# Patient Record
Sex: Female | Born: 1985 | Race: Black or African American | Hispanic: No | Marital: Single | Smoking: Never smoker
Health system: Southern US, Community
[De-identification: ages and names within clinical notes are randomized; demographics above are authoritative.]

## PROBLEM LIST (undated history)

## (undated) DIAGNOSIS — I10 Essential (primary) hypertension: Secondary | ICD-10-CM

## (undated) HISTORY — DX: Essential (primary) hypertension: I10

## (undated) HISTORY — PX: OTHER SURGICAL HISTORY: SHX169

---

## 2003-05-13 ENCOUNTER — Encounter: Admission: RE | Admit: 2003-05-13 | Discharge: 2003-05-13 | Payer: Self-pay | Admitting: Internal Medicine

## 2004-05-14 ENCOUNTER — Emergency Department (HOSPITAL_COMMUNITY): Admission: EM | Admit: 2004-05-14 | Discharge: 2004-05-15 | Payer: Self-pay | Admitting: Emergency Medicine

## 2009-07-21 ENCOUNTER — Emergency Department: Payer: Self-pay | Admitting: Emergency Medicine

## 2011-10-19 ENCOUNTER — Emergency Department: Payer: Self-pay | Admitting: Emergency Medicine

## 2012-06-02 IMAGING — CT CT ABD-PELV W/O CM
1 of 2 series · 16 of 32 positions shown, 20 images · non-contrast
Comparison: none

REASON FOR EXAM: (1) L flank and LLQ pain; (2) L flank and LLQ pain,
stone protocol
COMMENTS:   May transport without cardiac monitor

PROCEDURE:     CT  - CT ABDOMEN AND PELVIS W[DATE]  [DATE]
RESULT:
HISTORY: Left flank pain.

[Series 2: stone · axial · 0.89mm/px · z∈[-526,-124]mm · 16 of 146 slices shown, 20 images]
[im 6/146  soft-tissue]
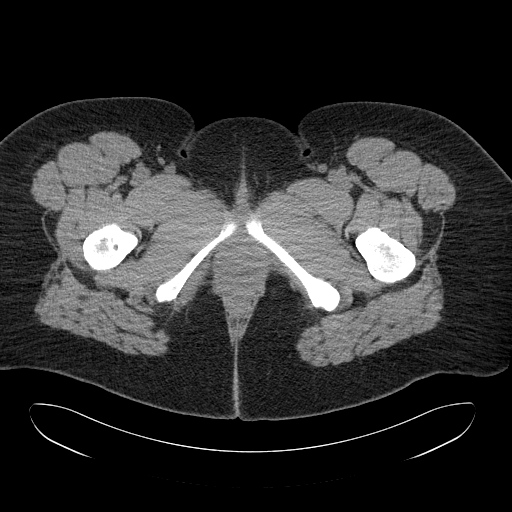
[im 6/146  bone]
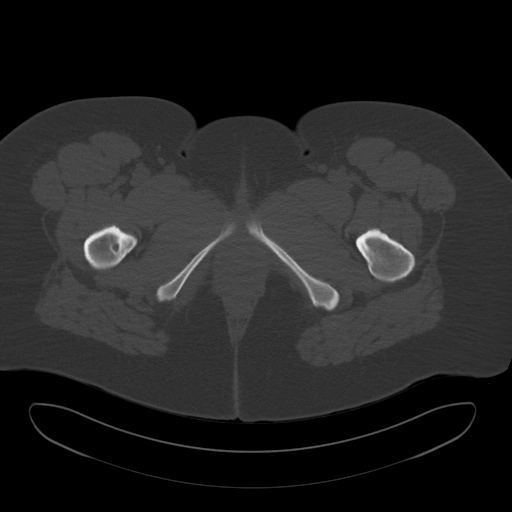
[im 18/146  soft-tissue]
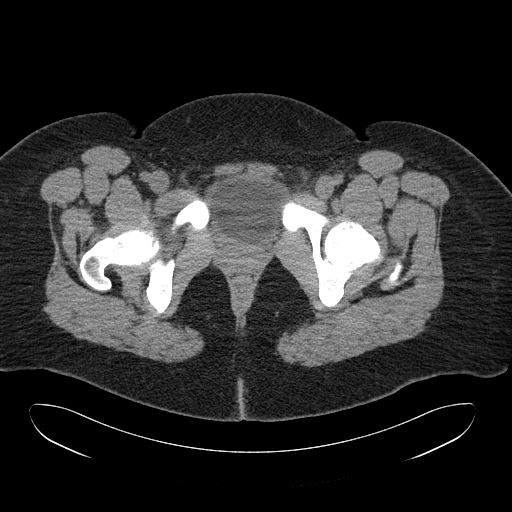
[im 30/146  soft-tissue]
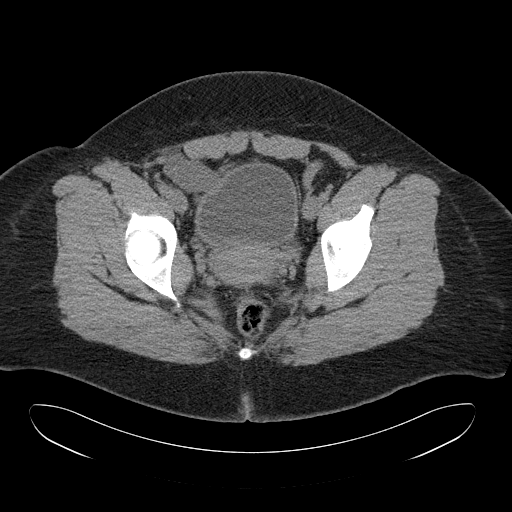
[im 41/146  soft-tissue]
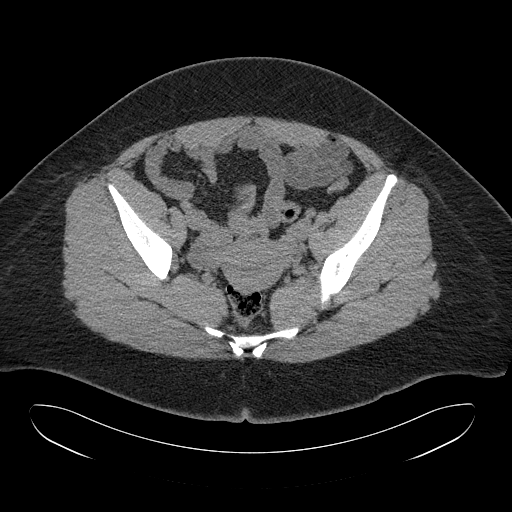
[im 47/146  soft-tissue]
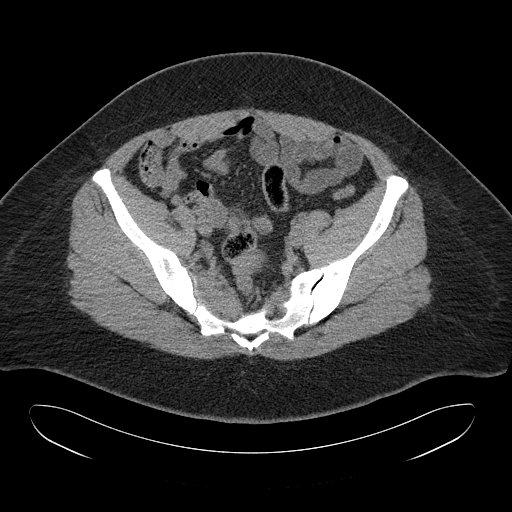
[im 59/146  soft-tissue]
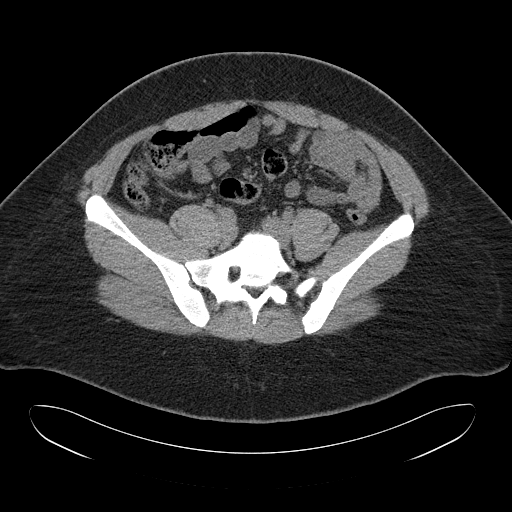
[im 70/146  soft-tissue]
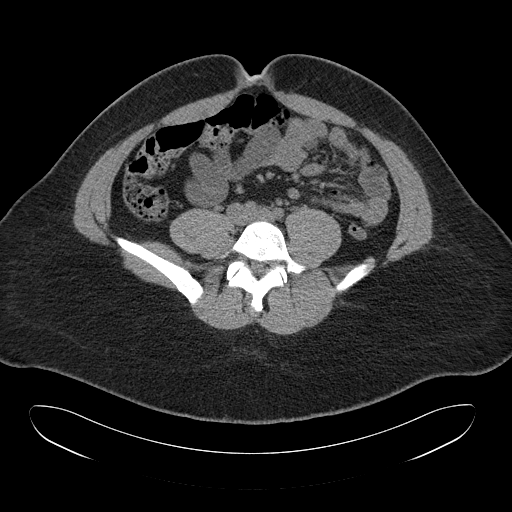
[im 76/146  soft-tissue]
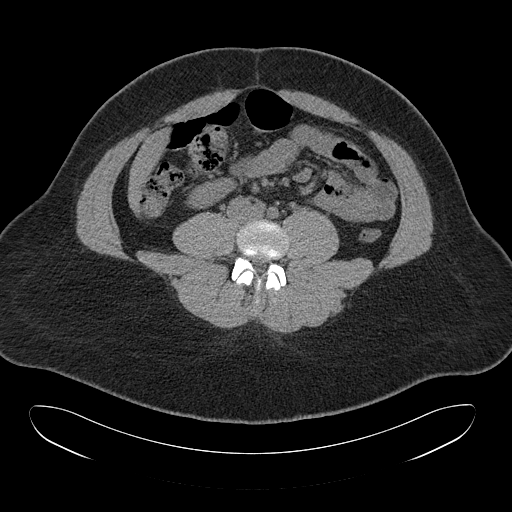
[im 88/146  soft-tissue]
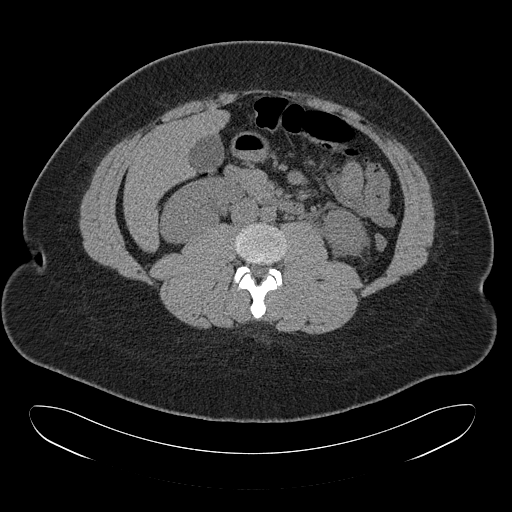
[im 88/146  bone]
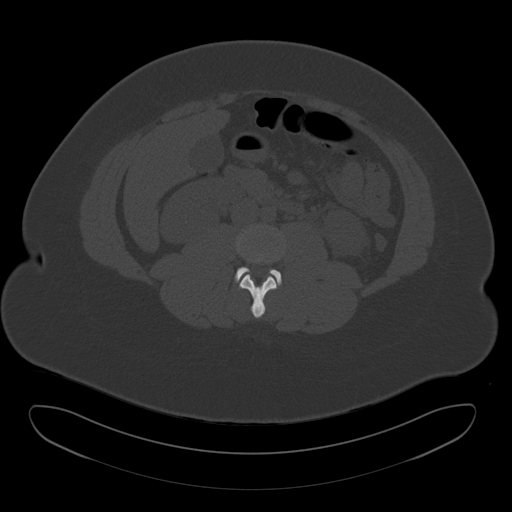
[im 99/146  soft-tissue]
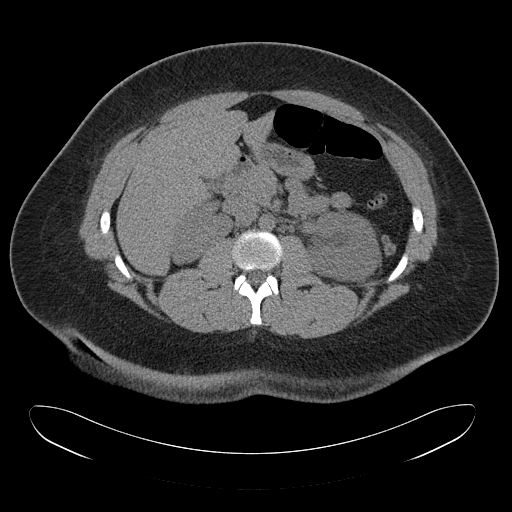
[im 111/146  soft-tissue]
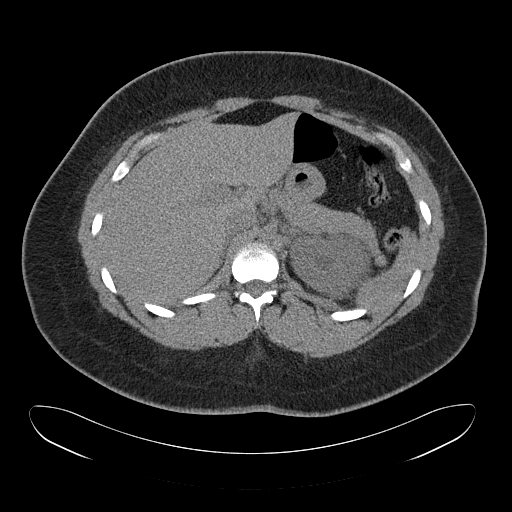
[im 117/146  soft-tissue]
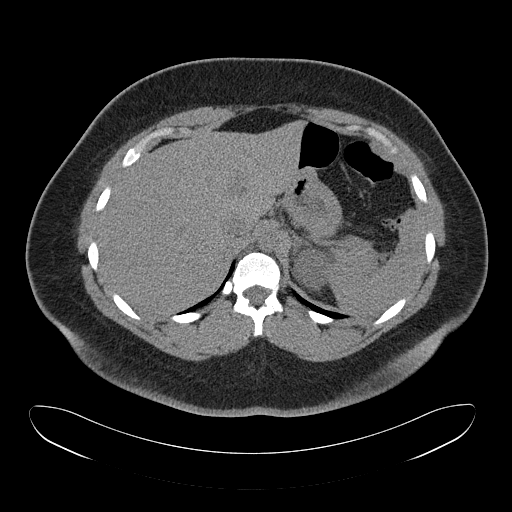
[im 122/146  lung]
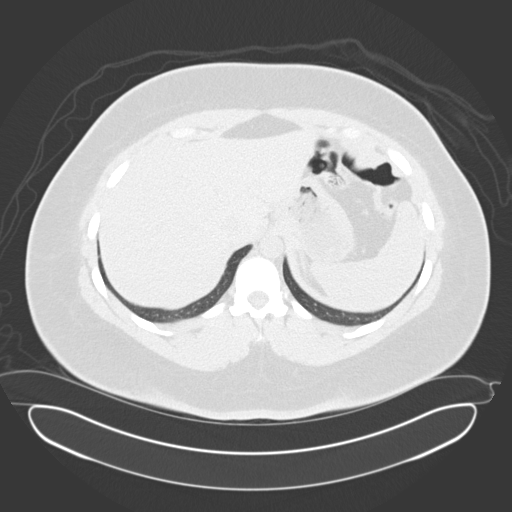
[im 128/146  soft-tissue]
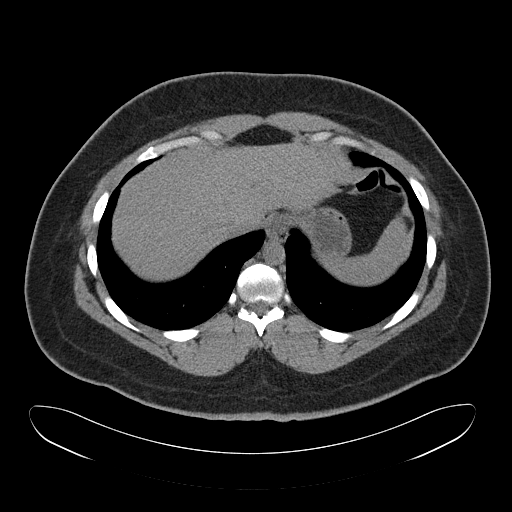
[im 128/146  lung]
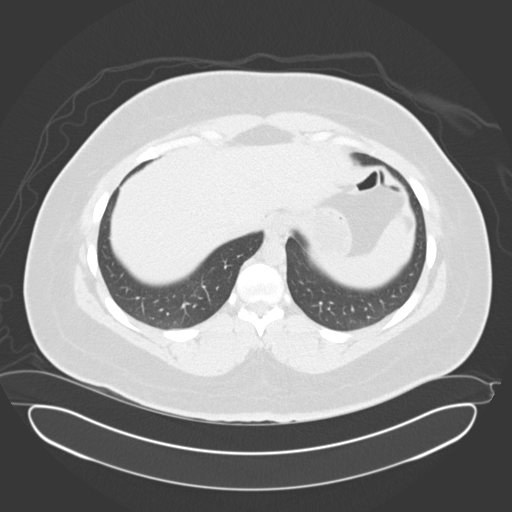
[im 134/146  lung]
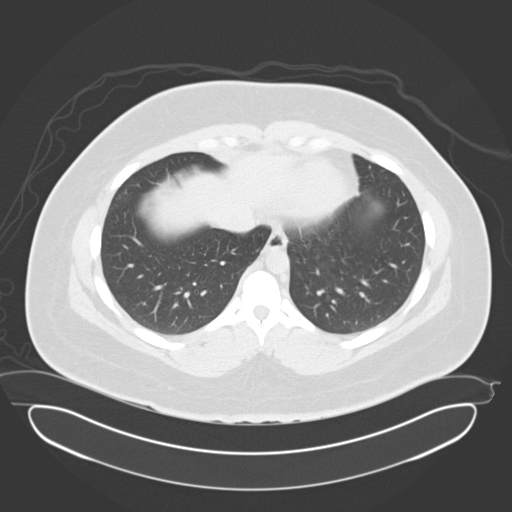
[im 140/146  soft-tissue]
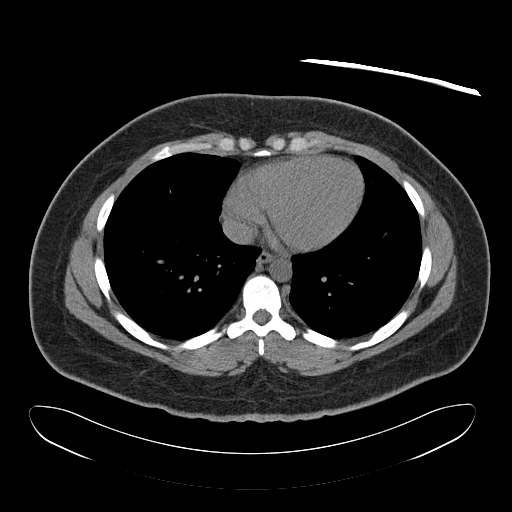
[im 140/146  lung]
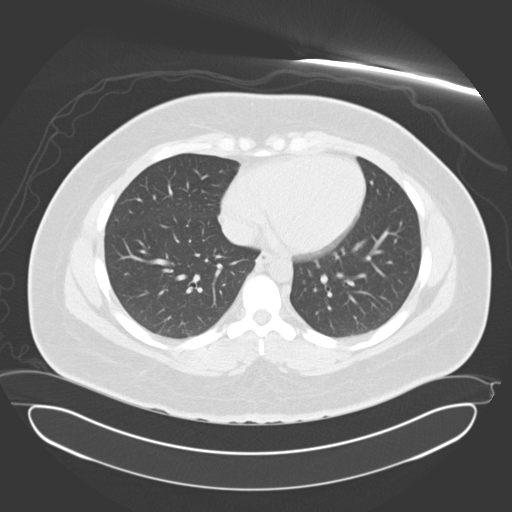

[16 of 32 positions shown; findings below may reference images not displayed]

FINDINGS: Standard nonenhanced head CT was obtained. The liver is normal.
Spleen is normal. Pancreas is normal. Adrenals are normal. Right kidney is
unremarkable. Left hydronephrosis and hydroureter is noted. There is an
approximately 6 mm stone in the distal left ureter. There is there is no
bowel distention. The bladder is unremarkable. Lung bases are clear.
IMPRESSION: 1.     6 mm calculus in the distal left ureter.
2.     There is mild hydronephrosis and hydroureter.

## 2014-12-07 LAB — HM PAP SMEAR: HM Pap smear: NEGATIVE

## 2017-12-22 DIAGNOSIS — E282 Polycystic ovarian syndrome: Secondary | ICD-10-CM | POA: Insufficient documentation

## 2017-12-22 DIAGNOSIS — I10 Essential (primary) hypertension: Secondary | ICD-10-CM | POA: Insufficient documentation

## 2017-12-22 LAB — HM HIV SCREENING LAB: HM HIV Screening: NEGATIVE

## 2018-12-24 DIAGNOSIS — E282 Polycystic ovarian syndrome: Secondary | ICD-10-CM

## 2018-12-24 DIAGNOSIS — I10 Essential (primary) hypertension: Secondary | ICD-10-CM

## 2018-12-25 ENCOUNTER — Other Ambulatory Visit: Payer: Self-pay

## 2018-12-25 DIAGNOSIS — Z20822 Contact with and (suspected) exposure to covid-19: Secondary | ICD-10-CM

## 2018-12-26 LAB — NOVEL CORONAVIRUS, NAA: SARS-CoV-2, NAA: NOT DETECTED

## 2019-01-05 ENCOUNTER — Ambulatory Visit (LOCAL_COMMUNITY_HEALTH_CENTER): Payer: Self-pay | Admitting: Family Medicine

## 2019-01-05 ENCOUNTER — Other Ambulatory Visit: Payer: Self-pay

## 2019-01-05 VITALS — BP 141/89 | Ht 68.0 in | Wt 290.0 lb

## 2019-01-05 DIAGNOSIS — Z30011 Encounter for initial prescription of contraceptive pills: Secondary | ICD-10-CM

## 2019-01-05 DIAGNOSIS — Z113 Encounter for screening for infections with a predominantly sexual mode of transmission: Secondary | ICD-10-CM

## 2019-01-05 DIAGNOSIS — Z3009 Encounter for other general counseling and advice on contraception: Secondary | ICD-10-CM

## 2019-01-05 MED ORDER — NORETHINDRONE 0.35 MG PO TABS: 1.0000 | ORAL_TABLET | Freq: Every day | ORAL | 0 refills | Status: DC

## 2019-01-05 MED ORDER — NORETHINDRONE 0.35 MG PO TABS: 1.0000 | ORAL_TABLET | Freq: Every day | ORAL | 11 refills | Status: DC

## 2019-01-05 NOTE — Progress Notes (Signed)
Last physical at [facility] 12/22/2017, #13 packs Micronor dispensed. Per [facility] records: 12/07/14 PAP was NIL & HPV negative (Cotest PAP due 2021, CBE due 2022). Desire STD checks, OCP supply. C/O tenderness in right breast. Pt states she has taken her BP pill this morning.

## 2019-01-05 NOTE — Progress Notes (Signed)
   [place] problem visit  [place] Department  Subjective:  Tammy Daugherty is a 33 y.o. being seen today for   Chief Complaint  Patient presents with  . Contraception    OCP supply  . SEXUALLY TRANSMITTED DISEASE    STD screening including bloodwork  . Breast Problem    Tenderness in right breast    HPI  Client is here for refill of her Micronor and STD screening.  Client last exam was 12/2017.  She denies STD symptoms.  Does the patient have a current or past history of drug use? No   No components found for: HCV]   Health Maintenance Due  Topic Date Due  . TETANUS/TDAP  07/08/2002  . INFLUENZA VACCINE  08/22/2018    ROS  The following portions of the patient's history were reviewed and updated as appropriate: allergies, current medications, past family history, past medical history, past social history, past surgical history and problem list. Problem list updated.   See flowsheet for other program required questions.  Objective:   Vitals:   01/05/19 0820  BP: (!) 141/89  Weight: 290 lb (131.5 kg)  Height: 5\' 8"  (1.727 m)    Physical Exam Constitutional:      Appearance: She is normal weight.  HENT:     Mouth/Throat:     Mouth: Mucous membranes are moist.     Pharynx: Oropharynx is clear. No oropharyngeal exudate or posterior oropharyngeal erythema.  Chest:     Breasts: Breasts are symmetrical.        Right: Normal.        Left: Normal.  Abdominal:     Palpations: Abdomen is soft. There is no mass.     Tenderness: There is no abdominal tenderness.     Hernia: There is no hernia in the left inguinal area or right inguinal area.  Genitourinary:    Pubic Area: No rash or pubic lice.      Labia:        Right: No rash, tenderness or lesion.        Left: No rash, tenderness, lesion or injury.      Cervix: Normal.     Comments: Bimanual declined Musculoskeletal:     Cervical back: Neck supple. No tenderness.   Lymphadenopathy:     Cervical: No cervical adenopathy.     Upper Body:     Right upper body: No supraclavicular, axillary or pectoral adenopathy.     Left upper body: No supraclavicular, axillary or pectoral adenopathy.     Lower Body: No right inguinal adenopathy. No left inguinal adenopathy.  Skin:    General: Skin is warm and dry.     Findings: No lesion or rash.  Neurological:     Mental Status: She is alert.  Psychiatric:        Mood and Affect: Mood normal.    Assessment and Plan:  Tammy Daugherty is a 33 y.o. female presenting to the [facility] Department for a Women's Health problem visit  1. General counseling and advice on contraceptive management   2. OCP (oral contraceptive pills) initiation - norethindrone (MICRONOR) 0.35 MG tablet; Take 1 tablet (0.35 mg total) by mouth daily.  Dispense: 1 Package; Refill: 11  3. Screening examination for venereal disease  - Chlamydia/Gonorrhea [place] [facility] - [facility] [place] [facility] - Syphilis Serology, [place]  No follow-ups on file.  No future appointments.  Hassell Done, FNP

## 2019-01-05 NOTE — Progress Notes (Signed)
Per verbal order by Hassell Done, FNP, dispensed #3 packs of Micronor (Norethindrone) to pt; pt to RTC before running out of OCP for physical. Pt counseled to used back up method like condoms with OCP restart and any time she misses or takes OCP late. Provider orders completed.

## 2019-01-23 ENCOUNTER — Other Ambulatory Visit: Payer: Self-pay

## 2019-01-23 ENCOUNTER — Encounter: Payer: Self-pay | Admitting: Emergency Medicine

## 2019-01-23 ENCOUNTER — Emergency Department: Payer: Self-pay

## 2019-01-23 DIAGNOSIS — E876 Hypokalemia: Secondary | ICD-10-CM | POA: Insufficient documentation

## 2019-01-23 DIAGNOSIS — R0789 Other chest pain: Secondary | ICD-10-CM | POA: Insufficient documentation

## 2019-01-23 DIAGNOSIS — Z79899 Other long term (current) drug therapy: Secondary | ICD-10-CM | POA: Insufficient documentation

## 2019-01-23 DIAGNOSIS — I10 Essential (primary) hypertension: Secondary | ICD-10-CM | POA: Insufficient documentation

## 2019-01-23 LAB — BASIC METABOLIC PANEL
Anion gap: 15 (ref 5–15)
BUN: 7 mg/dL (ref 6–20)
CO2: 26 mmol/L (ref 22–32)
Calcium: 9.4 mg/dL (ref 8.9–10.3)
Chloride: 99 mmol/L (ref 98–111)
Creatinine, Ser: 0.82 mg/dL (ref 0.44–1.00)
GFR calc Af Amer: >60 mL/min (ref >60)
GFR calc non Af Amer: >60 mL/min (ref >60)
Glucose, Bld: 132 mg/dL — ABNORMAL HIGH (ref 70–99)
Potassium: 3 mmol/L — ABNORMAL LOW (ref 3.5–5.1)
Sodium: 140 mmol/L (ref 135–145)

## 2019-01-23 LAB — CBC
HCT: 41.2 % (ref 36.0–46.0)
Hemoglobin: 13.1 g/dL (ref 12.0–15.0)
MCH: 24.5 pg — ABNORMAL LOW (ref 26.0–34.0)
MCHC: 31.8 g/dL (ref 30.0–36.0)
MCV: 77 fL — ABNORMAL LOW (ref 80.0–100.0)
Platelets: 369 10*3/uL (ref 150–400)
RBC: 5.35 MIL/uL — ABNORMAL HIGH (ref 3.87–5.11)
RDW: 15.4 % (ref 11.5–15.5)
WBC: 18.2 10*3/uL — ABNORMAL HIGH (ref 4.0–10.5)
nRBC: 0 % (ref 0.0–0.2)

## 2019-01-23 LAB — TROPONIN I (HIGH SENSITIVITY): Troponin I (High Sensitivity): 2 ng/L (ref <18)

## 2019-01-23 MED ORDER — SODIUM CHLORIDE 0.9% FLUSH: 3.0000 mL | Freq: Once | INTRAVENOUS | Status: DC

## 2019-01-23 NOTE — ED Triage Notes (Signed)
Pt to ED via POV, pt states that she has been having chest pain on and off since Monday. Pt states that the pain last for a few hours and then goes away. Pt states that a few weeks ago she was having issues with anxiety and since then she has been having the chest pain. Pt ambulatory into ED without difficulty or distress.

## 2019-01-24 ENCOUNTER — Emergency Department
Admission: EM | Admit: 2019-01-24 | Discharge: 2019-01-24 | Disposition: A | Discharge status: Home / Self Care | Payer: Self-pay | Attending: Emergency Medicine | Admitting: Emergency Medicine

## 2019-01-24 DIAGNOSIS — R0789 Other chest pain: Secondary | ICD-10-CM

## 2019-01-24 DIAGNOSIS — I10 Essential (primary) hypertension: Secondary | ICD-10-CM

## 2019-01-24 DIAGNOSIS — E876 Hypokalemia: Secondary | ICD-10-CM

## 2019-01-24 MED ORDER — POTASSIUM CHLORIDE CRYS ER 20 MEQ PO TBCR
40.0000 meq | EXTENDED_RELEASE_TABLET | Freq: Once | ORAL | Status: AC
  Administered 2019-01-24: 40 meq via ORAL
  Filled 2019-01-24: qty 2

## 2019-01-24 NOTE — ED Notes (Signed)
Patient educated on food rich in potassium. Patient also encouraged to see MD concerning anxiety as well as non medicated therapies to help with anxiety. Discharge orders reviewed thoroughly with patient. Patient verbalized understanding. AVS given to patient at discharge.

## 2019-01-24 NOTE — ED Notes (Signed)
Patient c/o anxiety and tingling to extremities. Patient with stressors due to increase stress due to death of family member.

## 2019-01-24 NOTE — ED Notes (Signed)
No peripheral IV placed this visit.

## 2019-01-24 NOTE — Discharge Instructions (Addendum)
Your workup in the Emergency Department today was reassuring.  We did not find any specific abnormalities other than a low potassium level which is unlikely to have caused her symptoms.  I gave you a potassium supplement and encourage you to read through the included information about the potassium content of foods.  We recommend you drink plenty of fluids, take your regular medications and/or any new ones prescribed today, and follow up with the doctor(s) listed in these documents as recommended.  Return to the Emergency Department if you develop new or worsening symptoms that concern you.

## 2019-01-24 NOTE — ED Provider Notes (Signed)
[facility] Emergency Department Provider Note  ____________________________________________   First MD Initiated Contact with Patient 01/24/19 0014     (approximate)  I have reviewed the triage vital signs and the nursing notes.   HISTORY  Chief Complaint Chest Pain    HPI Tammy Daugherty is a 34 y.o. female with medical history as listed below who presents for evaluation of persistent  left upper chest/shoulder aching discomfort for the last 5 to 6 days.  She says that she suffers from anxiety and had a recent loss in her family.  She had an anxiety or panic attack 5 to 6 days ago and since that point she has had some pain in the very upper part of the left side of her chest and up into her shoulder.  She can reproduce it but it also feels a little bit better if she rubs on the area.  She is having no shortness of breath.  She denies contact with known COVID-19 patients.  She has no fever/chills, loss of smell or taste, sore throat, shortness of breath, cough, nausea, vomiting, nor abdominal pain.  She says she continues to feel a little bit jittery from time to time and she thinks that she is having some issues with her blood sugar because sometimes she will feel better if she drinks a soda.  She has been losing weight and she said that she knows her diet "is not great".  The chest discomfort is mild but since it has been going on for about a week she thought she should be checked out.  She has no history of blood clots in the legs of the lungs, no unilateral leg pain or swelling, no recent immobilizations or travel, and she does take a contraceptive agent.        Past Medical History:  Diagnosis Date  . Hypertension     Patient Active Problem List   Diagnosis Date Noted  . Polycystic ovarian disease 12/22/2017  . Hypertension 12/22/2017  . Morbid obesity (HCC) 12/22/2017    Past Surgical History:  Procedure Laterality Date  . denies      Prior  to Admission medications   Medication Sig Start Date End Date Taking? Authorizing Provider  hydrochlorothiazide (HYDRODIURIL) 25 MG tablet Take 25 mg by mouth daily.    [provider]  Multiple Vitamins-Minerals (EMERGEN-C IMMUNE PLUS/VIT D) CHEW Chew 1 tablet by mouth 1 day or 1 dose.    [provider]  norethindrone (MICRONOR) 0.35 MG tablet Take 1 tablet (0.35 mg total) by mouth daily. 01/05/19   Larene Pickett, FNP  OVER THE COUNTER MEDICATION 2 units of lipase. 2 tablespoons of Sea Moss Gel qd    [provider]    Allergies Patient has no known allergies.  Family History  Problem Relation Age of Onset  . Hyperlipidemia Father   . High Cholesterol Father   . Kidney disease Father   . Heart disease Father   . Heart disease Maternal Grandmother   . Heart attack Maternal Grandmother   . Aneurysm Maternal Grandfather   . Heart attack Paternal Grandfather     Social History Social History   Tobacco Use  . Smoking status: Never Smoker  . Smokeless tobacco: Never Used  Substance Use Topics  . Alcohol use: Not Currently  . Drug use: Not Currently    Review of Systems Constitutional: No fever/chills Eyes: No visual changes. ENT: No sore throat. Cardiovascular: +chest pain. Respiratory: Denies shortness  of breath. Gastrointestinal: No abdominal pain.  No nausea, no vomiting.  No diarrhea.  No constipation. Genitourinary: Negative for dysuria. Musculoskeletal: Negative for neck pain.  Negative for back pain. Integumentary: Negative for rash. Neurological: Negative for headaches, focal weakness or numbness. Psych:  anxiety   ____________________________________________   PHYSICAL EXAM:  VITAL SIGNS: ED Triage Vitals  Enc Vitals Group     BP 01/23/19 1847 (!) 175/88     Pulse Rate 01/23/19 1847 80     Resp 01/23/19 1847 16     Temp 01/23/19 1847 99.2 F (37.3 C)     Temp Source 01/23/19 1847 Oral     SpO2 01/23/19 1847 100 %      Weight 01/23/19 1845 126.1 kg (278 lb)     Height 01/23/19 1845 1.727 m (5\' 8" )     Head Circumference --      Peak Flow --      Pain Score 01/23/19 1845 1     Pain Loc --      Pain Edu? --      Excl. in [place]? --     Constitutional: Alert and oriented.  No acute distress. Eyes: Conjunctivae are normal.  Head: Atraumatic. Nose: No congestion/rhinnorhea. Mouth/Throat: Patient is wearing a mask. Neck: No stridor.  No meningeal signs.   Cardiovascular: Normal rate, regular rhythm. Good peripheral circulation. Grossly normal heart sounds. Respiratory: Normal respiratory effort.  No retractions. Gastrointestinal: Soft and nontender. No distention.  Musculoskeletal: Reproducible mild tenderness in the superior part of the left chest, very close to the the lateral third of the clavicle.  No lower extremity tenderness nor edema. No gross deformities of extremities. Neurologic:  Normal speech and language. No gross focal neurologic deficits are appreciated.  Skin:  Skin is warm, dry and intact. Psychiatric: Mood and affect are normal. Speech and behavior are normal.  ____________________________________________   LABS (all labs ordered are listed, but only abnormal results are displayed)  Labs Reviewed  BASIC METABOLIC PANEL - Abnormal; Notable for the following components:      Result Value   Potassium 3.0 (*)    Glucose, Bld 132 (*)    All other components within normal limits  CBC - Abnormal; Notable for the following components:   WBC 18.2 (*)    RBC 5.35 (*)    MCV 77.0 (*)    MCH 24.5 (*)    All other components within normal limits  POC URINE PREG, ED  TROPONIN I (HIGH SENSITIVITY)   ____________________________________________  EKG  ED ECG REPORT I, Hinda Kehr, the attending physician, personally viewed and interpreted this ECG.  Date: 01/23/2019 EKG Time: 18: 49 Rate: 80 Rhythm: normal sinus rhythm QRS Axis: normal Intervals: normal ST/T Wave abnormalities:  normal Narrative Interpretation: no evidence of acute ischemia ____________________________________________  RADIOLOGY I, Hinda Kehr, personally viewed and evaluated these images (plain radiographs) as part of my medical decision making, as well as reviewing the written report by the radiologist.  ED MD interpretation: No acute abnormality identified on chest x-ray  Official radiology report(s): DG Chest 2 View  Result Date: 01/23/2019 CLINICAL DATA:  Chest pain EXAM: CHEST - 2 VIEW COMPARISON:  None. FINDINGS: The heart size and mediastinal contours are within normal limits. Both lungs are clear. The visualized skeletal structures are unremarkable. IMPRESSION: No acute abnormality of the lungs. Electronically Signed   By: Eddie Candle M.D.   On: 01/23/2019 19:15    ____________________________________________   PROCEDURES   Procedure(s) performed (  including Critical Care):  Procedures   ____________________________________________   INITIAL IMPRESSION / MDM / ASSESSMENT AND PLAN / ED COURSE  As part of my medical decision making, I reviewed the following data within the electronic MEDICAL RECORD NUMBER Nursing notes reviewed and incorporated, Labs reviewed , EKG interpreted , Old chart reviewed, Radiograph reviewed  and Notes from prior ED visits   Differential diagnosis includes, but is not limited to, anxiety/panic attacks, musculoskeletal strain, costochondritis, pericarditis/myocarditis, PE, ACS, pneumonia.  Vital signs are stable, afebrile, no tachycardia, no hypoxemia.  Metabolic panel is within normal limits other than some mild hypokalemia for which I provided 40 mEq of potassium by mouth.  She has a leukocytosis of 18.2 with no clear source although this could be a stress reaction/demargination of leukocytes given that she has no other infectious signs or symptoms.  High-sensitivity troponin was 2 after 5 to 6 days of symptoms and there is no indication to repeat it based also  on her low HEAR score.  EKG shows no signs of ischemia.  She has a well score for PE of 0 and would be PERC negative except that I am uncertain whether or not her contraception counts as "exogenous estrogen".  The patient feels reassured by the results of her work-up and will follow up with her primary care doctor regarding her hypertension and other issues.  No indication for emergent intervention at this time and there is no indication of an emergent medical condition at this time.  I gave my usual customary return precautions.        ____________________________________________  FINAL CLINICAL IMPRESSION(S) / ED DIAGNOSES  Final diagnoses:  Atypical chest pain  Essential hypertension  Hypokalemia     MEDICATIONS GIVEN DURING THIS VISIT:  Medications  sodium chloride flush (NS) 0.9 % injection 3 mL (has no administration in time range)  potassium chloride SA (KLOR-CON) CR tablet 40 mEq (has no administration in time range)     ED Discharge Orders    None      *Please note:  Tammy Daugherty was evaluated in Emergency Department on 01/24/2019 for the symptoms described in the history of present illness. She was evaluated in the context of the global COVID-19 pandemic, which necessitated consideration that the patient might be at risk for infection with the SARS-CoV-2 virus that causes COVID-19. Institutional protocols and algorithms that pertain to the evaluation of patients at risk for COVID-19 are in a state of rapid change based on information released by regulatory bodies including the CDC and federal and state organizations. These policies and algorithms were followed during the patient's care in the ED.  Some ED evaluations and interventions may be delayed as a result of limited staffing during the pandemic.*  Note:  This document was prepared using Dragon voice recognition software and may include unintentional dictation errors.   Loleta Rose, MD 01/24/19 [phone]

## 2019-02-22 ENCOUNTER — Ambulatory Visit: Payer: Self-pay | Attending: Internal Medicine

## 2019-02-22 DIAGNOSIS — Z20822 Contact with and (suspected) exposure to covid-19: Secondary | ICD-10-CM | POA: Insufficient documentation

## 2019-02-23 LAB — NOVEL CORONAVIRUS, NAA: SARS-CoV-2, NAA: NOT DETECTED

## 2019-03-01 ENCOUNTER — Ambulatory Visit: Payer: Self-pay | Attending: Internal Medicine

## 2019-03-01 DIAGNOSIS — Z20822 Contact with and (suspected) exposure to covid-19: Secondary | ICD-10-CM | POA: Insufficient documentation

## 2019-03-02 LAB — NOVEL CORONAVIRUS, NAA: SARS-CoV-2, NAA: NOT DETECTED

## 2019-03-29 ENCOUNTER — Other Ambulatory Visit: Payer: Self-pay

## 2019-03-29 ENCOUNTER — Ambulatory Visit (LOCAL_COMMUNITY_HEALTH_CENTER): Payer: Self-pay | Admitting: Advanced Practice Midwife

## 2019-03-29 ENCOUNTER — Encounter: Payer: Self-pay | Admitting: Advanced Practice Midwife

## 2019-03-29 VITALS — BP 148/82 | Ht 68.5 in | Wt 257.8 lb

## 2019-03-29 DIAGNOSIS — Z3009 Encounter for other general counseling and advice on contraception: Secondary | ICD-10-CM

## 2019-03-29 DIAGNOSIS — Z3041 Encounter for surveillance of contraceptive pills: Secondary | ICD-10-CM

## 2019-03-29 DIAGNOSIS — Z30011 Encounter for initial prescription of contraceptive pills: Secondary | ICD-10-CM

## 2019-03-29 MED ORDER — NORETHINDRONE 0.35 MG PO TABS: 1.0000 | ORAL_TABLET | Freq: Every day | ORAL | 0 refills | Status: AC

## 2019-03-29 NOTE — Progress Notes (Signed)
   [place] problem visit  [place] Department  Subjective:  Tammy Daugherty is a 34 y.o.SBF G1P0 nonsmoker being seen today for more Micronor  Chief Complaint  Patient presents with  . Contraception    needs more OC    HPI  LMP 03/26/19.  Last sex 03/19/19 without condom.  Forgets to take Micronor 7-8x/mo and takes it late daily.  Lst physical 12/22/17 when given #13 packs of Micronor.  Last pap 12/07/14 neg HPV neg.  CBE due 2022. BP 148/82.  States takes HCTZ daily but last appt was approx 6 mo ago Does the patient have a current or past history of drug use? No   No components found for: HCV]   Health Maintenance Due  Topic Date Due  . TETANUS/TDAP  07/08/2002  . INFLUENZA VACCINE  08/22/2018    ROS  The following portions of the patient's history were reviewed and updated as appropriate: allergies, current medications, past family history, past medical history, past social history, past surgical history and problem list. Problem list updated.   See flowsheet for other program required questions.  Objective:   Vitals:   03/29/19 1013  BP: (!) 148/82  Weight: 257 lb 12.8 oz (116.9 kg)  Height: 5' 8.5" (1.74 m)    Physical Exam  n/a  Assessment and Plan:  Tammy Daugherty is a 34 y.o. female presenting to the [facility] Department for a Women's Health problem visit  1. Family planning Needs pap 11/2019 Encouraged appt with MD re: HTN ASAP Counseled pt that she will become pregnant if she continues to take ocp's inconsistently--pt understands and does not want another form of birth control and doesn't want to be pregnant  2. Encounter for surveillance of contraceptive pills Please give Micronor #6 I po daily Please counsel on need to take daily at same time      No follow-ups on file.  No future appointments.  Herbie Saxon, CNM

## 2019-03-29 NOTE — Progress Notes (Signed)
Patient here for refill of OC. Received 3 packs Micronor 01/05/2020. Last PE 12/22/2017 [facility]. Last Pap 12/07/2014, NIL and HPV negative. Cotest pap due 2021 and CBE due 2022. Went to ED 01/23/2019 for chest pain EKG done, states she feels pain was muscular/anxiety.Marland KitchenMarland KitchenBurt Knack, RN

## 2019-03-29 NOTE — Progress Notes (Signed)
Patient given 6 packs Micronor per provider orders. Patient counseled to call for appointment when she opens 6th pack .Marland KitchenBurt Knack, RN

## 2019-08-02 ENCOUNTER — Ambulatory Visit: Payer: Self-pay

## 2019-08-04 ENCOUNTER — Ambulatory Visit: Payer: Self-pay

## 2019-08-05 ENCOUNTER — Encounter: Payer: Self-pay | Admitting: Physician Assistant

## 2019-08-05 ENCOUNTER — Other Ambulatory Visit: Payer: Self-pay

## 2019-08-05 ENCOUNTER — Ambulatory Visit (LOCAL_COMMUNITY_HEALTH_CENTER): Payer: Self-pay | Admitting: Physician Assistant

## 2019-08-05 VITALS — BP 147/87 | Ht 67.0 in | Wt 246.0 lb

## 2019-08-05 DIAGNOSIS — Z113 Encounter for screening for infections with a predominantly sexual mode of transmission: Secondary | ICD-10-CM

## 2019-08-05 DIAGNOSIS — N760 Acute vaginitis: Secondary | ICD-10-CM

## 2019-08-05 DIAGNOSIS — Z3009 Encounter for other general counseling and advice on contraception: Secondary | ICD-10-CM

## 2019-08-05 DIAGNOSIS — B9689 Other specified bacterial agents as the cause of diseases classified elsewhere: Secondary | ICD-10-CM

## 2019-08-05 DIAGNOSIS — Z3041 Encounter for surveillance of contraceptive pills: Secondary | ICD-10-CM

## 2019-08-05 DIAGNOSIS — Z30011 Encounter for initial prescription of contraceptive pills: Secondary | ICD-10-CM

## 2019-08-05 LAB — WET PREP FOR TRICH, YEAST, CLUE
Trichomonas Exam: NEGATIVE
Yeast Exam: NEGATIVE

## 2019-08-05 MED ORDER — NORETHINDRONE 0.35 MG PO TABS: 1.0000 | ORAL_TABLET | Freq: Every day | ORAL | 5 refills | Status: AC

## 2019-08-05 MED ORDER — THERA VITAL M PO TABS: 1.0000 | ORAL_TABLET | Freq: Every day | ORAL | 0 refills | Status: AC

## 2019-08-05 MED ORDER — METRONIDAZOLE 500 MG PO TABS: 500.0000 mg | ORAL_TABLET | Freq: Two times a day (BID) | ORAL | 0 refills | Status: AC

## 2019-08-05 NOTE — Progress Notes (Addendum)
Patient here stating she think she needs PE, considering pregnancy within next year. States having irregular periods, light bleeding/spotting in between periods for the past 2 months. Taking OC regularly since 03/2019. Needs more OC today, taking Micronor. PE due 12/2019, per FYI's. Told at that 12/2018 visit she would need PE at next visit, but PE not done in 03/2019. Patient was given 6 packs Micronor in 03/2019. Last Pap 12/07/2014, Negative, HPV negative.Burt Knack, RN

## 2019-08-05 NOTE — Progress Notes (Signed)
Wet mount reviewed, patient treated for BV. Patient given 4 packs Micronor due to to expiration dates. Given 2 packs each of different lot numbers and counseled how to take relative to expiration dates. Patient almost done with current pack and will start her last pack from last order in 1 week. Patient to start OC's given today in Mid-August when 6th pack from previous order finished. Patient counseled to call in late November for 12/2019 PE.Marland KitchenBurt Knack, RN

## 2019-08-08 NOTE — Progress Notes (Signed)
[facility]  Subjective:  Tammy Daugherty is a 34 y.o. being seen today for more OCs and STD screening.  Chief Complaint  Patient presents with  . Contraception    HPI  Patient states that she needs more OCs to last until her RP is due in 12/2019.  Also, requests pelvic exam to check for infections since she has had some irregular spotting.  Denies vaginal symptoms except for bleeding.  States that she takes her Micronor/OC within a 30 minute window of time each day.  Reports same partner for 6 years.  States that she takes 2 medicines for her blood pressure.  Also, reports that she is thinking about getting pregnant but has not decided yet.     Does the patient have a current or past history of drug use? No   No components found for: HCV]   Health Maintenance Due  Topic Date Due  . Hepatitis C Screening  Never done  . COVID-19 Vaccine (1) Never done  . TETANUS/TDAP  Never done    Review of Systems  All other systems reviewed and are negative.   The following portions of the patient's history were reviewed and updated as appropriate: allergies, current medications, past family history, past medical history, past social history, past surgical history and problem list. Problem list updated.   See flowsheet for other program required questions.  Objective:   Vitals:   08/05/19 0921  BP: (!) 147/87  Weight: 246 lb (111.6 kg)  Height: 5\' 7"  (1.702 m)    Physical Exam Constitutional:      General: She is not in acute distress.    Appearance: Normal appearance.  HENT:     Head: Normocephalic and atraumatic.  Pulmonary:     Effort: Pulmonary effort is normal.  Abdominal:     Palpations: Abdomen is soft. There is no mass.     Tenderness: There is no abdominal tenderness. There is no guarding or rebound.  Genitourinary:    General: Normal vulva.     Rectum: Normal.     Comments: External  genitalia/pubic area without nits, lice, edema, erythema, lesions and inguinal adenopathy. Vagina with normal mucosa and small amount of pinkish discharge. Cervix without visible lesions. Uterus firm, mobile, nt, no masses, no CMT, no adnexal tenderness or fullness.  Skin:    General: Skin is warm and dry.     Findings: No bruising, erythema, lesion or rash.  Neurological:     Mental Status: She is alert and oriented to person, place, and time.  Psychiatric:        Mood and Affect: Mood normal.        Behavior: Behavior normal.        Thought Content: Thought content normal.        Judgment: Judgment normal.       Assessment and Plan:  Tammy Daugherty is a 34 y.o. female presenting to the [facility] Department for a Women's Health problem visit  1. Encounter for counseling regarding contraception Counseled patient re:  Importance of taking OC as close to same time each day as possible to prevent irregular bleeding. Rec condoms with all sex. Enc patient to discuss pregnancy plans with PCP so they can advise re:  BP meds. - Multiple Vitamins-Minerals (MULTIVITAMIN) tablet; Take 1 tablet by mouth daily.  Dispense: 100 tablet; Refill: 0  2. Screening for STD (sexually transmitted disease) Await test results.  Counseled that RN will call if needs to RTC for further treatment once results are back.  - WET PREP FOR TRICH, YEAST, CLUE - Chlamydia/Gonorrhea [place] [facility] - HIV [place] [facility] - Syphilis Serology, [facility] [place] [facility]  3. Surveillance of previously prescribed contraceptive pill Continue with Norethindrone 28 d #5 1 po daily as BCM. - norethindrone (MICRONOR) 0.35 MG tablet; Take 1 tablet (0.35 mg total) by mouth daily.  Dispense: 28 tablet; Refill: 5  4. BV (bacterial vaginosis) Treat for BV with Metronidazole 500 mg #14 1 po BID for 7 days with food, no EtOH for 24 hr before and until 72 hr after completing medicine. No sex for 7 days. Enc to use OTC antifungal  cream if has itching during or just after treatment with antibiotics. - metroNIDAZOLE (FLAGYL) 500 MG tablet; Take 1 tablet (500 mg total) by mouth 2 (two) times daily for 7 days.  Dispense: 14 tablet; Refill: 0     No follow-ups on file.  No future appointments.  Matt Holmes, PA

## 2020-01-05 ENCOUNTER — Ambulatory Visit (LOCAL_COMMUNITY_HEALTH_CENTER): Payer: Self-pay | Admitting: Physician Assistant

## 2020-01-05 ENCOUNTER — Encounter: Payer: Self-pay | Admitting: Physician Assistant

## 2020-01-05 ENCOUNTER — Other Ambulatory Visit: Payer: Self-pay

## 2020-01-05 VITALS — BP 144/79 | Ht 68.5 in | Wt 243.0 lb

## 2020-01-05 DIAGNOSIS — Z3009 Encounter for other general counseling and advice on contraception: Secondary | ICD-10-CM

## 2020-01-05 DIAGNOSIS — Z01419 Encounter for gynecological examination (general) (routine) without abnormal findings: Secondary | ICD-10-CM

## 2020-01-05 DIAGNOSIS — Z3041 Encounter for surveillance of contraceptive pills: Secondary | ICD-10-CM

## 2020-01-05 DIAGNOSIS — Z30011 Encounter for initial prescription of contraceptive pills: Secondary | ICD-10-CM

## 2020-01-05 MED ORDER — NORETHINDRONE 0.35 MG PO TABS: 1.0000 | ORAL_TABLET | Freq: Every day | ORAL | 13 refills | Status: AC

## 2020-01-05 NOTE — Progress Notes (Signed)
Pt is here for physical, pap and more birth control pills. Pt reports is on Norethindrone and finished birth control pills yesterday 01/04/2020. Pt reports last sex was 12/04/2019. Pt reports has had some missed or late birth control pills. Pt reports missed about 4 pills within the last month and did not use any condoms as a back up method. Pt filling out PHQ9.

## 2020-01-05 NOTE — Progress Notes (Signed)
Repeat BP 126/81. Pt received Norethindrone #13 packs today per provider order. Counseled pt per provider orders and pt states understanding. Pt declines condoms today. Provider orders completed.

## 2020-01-06 ENCOUNTER — Encounter: Payer: Self-pay | Admitting: Physician Assistant

## 2020-01-06 NOTE — Progress Notes (Signed)
Family Planning Visit- Repeat Yearly Visit  Subjective:  Tammy Daugherty is a 34 y.o. G1P0010  being seen today for an well woman visit and to discuss family planning options.    She is currently using Progesterone only pills for pregnancy prevention. Patient reports she does not want a pregnancy in the next year. Patient  has Polycystic ovarian disease; Hypertension--HCTZ; and Morbid obesity (HCC) BMI=38.6 on their problem list.  Chief Complaint  Patient presents with  . Contraception    Physical, pap and birth control    Patient reports that she is doing well with her OCs and desires to continue with these as her BCM.  Reports that she has been working on losing some weight and is pleased with her progress so far.  Per chart review, CBE is due in 2022 and pap is due today.   Patient denies any concerns today.    See flowsheet for other program required questions.   Body mass index is 36.41 kg/m. - Patient is eligible for diabetes screening based on BMI and age >84?  not applicable HA1C ordered? not applicable  Patient reports 2  partners in last year. Desires STI screening?  No - patient declines.   Has patient been screened once for HCV in the past?  No  No results found for: HCVAB  Does the patient have current of drug use, have a partner with drug use, and/or has been incarcerated since last result? No  If yes-- Screen for HCV through [facility]   Does the patient meet criteria for HBV testing? No  Criteria:  -Household, sexual or needle sharing contact with HBV -History of drug use -HIV positive -Those with known Hep C   Health Maintenance Due  Topic Date Due  . Hepatitis C Screening  Never done  . COVID-19 Vaccine (1) Never done  . TETANUS/TDAP  Never done  . INFLUENZA VACCINE  Never done  . PAP SMEAR-Modifier  12/07/2019    Review of Systems  All other systems reviewed and are negative.   The following portions of the patient's history were reviewed  and updated as appropriate: allergies, current medications, past family history, past medical history, past social history, past surgical history and problem list. Problem list updated.  Objective:   Vitals:   01/05/20 1045  BP: (!) 144/79  Weight: 243 lb (110.2 kg)  Height: 5' 8.5" (1.74 m)    Physical Exam Vitals and nursing note reviewed.  Constitutional:      General: She is not in acute distress.    Appearance: Normal appearance.  HENT:     Head: Normocephalic and atraumatic.     Mouth/Throat:     Mouth: Mucous membranes are moist.     Pharynx: Oropharynx is clear. No oropharyngeal exudate or posterior oropharyngeal erythema.  Eyes:     Conjunctiva/sclera: Conjunctivae normal.  Neck:     Thyroid: No thyroid mass, thyromegaly or thyroid tenderness.  Cardiovascular:     Rate and Rhythm: Normal rate and regular rhythm.  Pulmonary:     Effort: Pulmonary effort is normal.     Breath sounds: Normal breath sounds.  Abdominal:     Palpations: Abdomen is soft. There is no mass.     Tenderness: There is no abdominal tenderness. There is no guarding or rebound.  Genitourinary:    General: Normal vulva.     Rectum: Normal.     Comments: External genitalia/pubic area without nits, lice, edema, erythema, lesions and inguinal adenopathy.  Vagina with normal mucosa and discharge. Cervix without visible lesions. Uterus firm, mobile, nt, no masses, no CMT, no adnexal tenderness or fullness. Musculoskeletal:     Cervical back: Neck supple. No tenderness.  Lymphadenopathy:     Cervical: No cervical adenopathy.  Skin:    General: Skin is warm and dry.     Findings: No bruising, erythema, lesion or rash.  Neurological:     Mental Status: She is alert and oriented to person, place, and time.  Psychiatric:        Mood and Affect: Mood normal.        Behavior: Behavior normal.        Thought Content: Thought content normal.        Judgment: Judgment normal.       Assessment and  Plan:  Tammy Daugherty is a 34 y.o. female G1P0010 presenting to the [facility] Department for an yearly well woman exam/family planning visit  Contraception counseling: Reviewed all forms of birth control options in the tiered based approach. available including abstinence; over the counter/barrier methods; hormonal contraceptive medication including pill, patch, ring, injection,contraceptive implant, ECP; hormonal and nonhormonal IUDs; permanent sterilization options including vasectomy and the various tubal sterilization modalities. Risks, benefits, and typical effectiveness rates were reviewed.  Questions were answered.  Written information was also given to the patient to review.  Patient desires to continue with her current pill, this was prescribed for patient. She will follow up in  1 year and prn for surveillance.  She was told to call with any further questions, or with any concerns about this method of contraception.  Emphasized use of condoms 100% of the time for STI prevention.  Patient was not a candidate for ECP today.   1. Encounter for counseling regarding contraception Counseled patient re: SE of OC and when to call clinic for concerns. Enc patient to take OC at the same time each day for better effectiveness and to decrease chance of irregular bleeding. Enc to use condoms with all sex and especially if late OCs.  2. Well woman exam with routine gynecological exam Reviewed with patient healthy habits to maintain general health. Enc MVI 1 po daily. Enc to establish with/ follow up with PCP for primary care concerns, age appropriate screenings and illness. Await pap results.  Counseled that RN will call or send letter once results are back. - IGP, Aptima HPV  3. Surveillance of previously prescribed contraceptive pill Continue with Norethindrone 28d #13 1 po daily at the same time each day. - norethindrone (MICRONOR) 0.35 MG tablet; Take 1 tablet (0.35 mg total) by  mouth daily.  Dispense: 28 tablet; Refill: 13     Return for annual and PRN.  No future appointments.  Matt Holmes, PA

## 2020-01-11 LAB — IGP, APTIMA HPV
HPV Aptima: NEGATIVE
PAP Smear Comment: 0

## 2020-08-05 ENCOUNTER — Emergency Department
Admission: EM | Admit: 2020-08-05 | Discharge: 2020-08-05 | Disposition: A | Discharge status: Home / Self Care | Payer: BLUE CROSS/BLUE SHIELD | Attending: Emergency Medicine | Admitting: Emergency Medicine

## 2020-08-05 ENCOUNTER — Other Ambulatory Visit: Payer: Self-pay

## 2020-08-05 ENCOUNTER — Emergency Department: Payer: BLUE CROSS/BLUE SHIELD

## 2020-08-05 DIAGNOSIS — Z20822 Contact with and (suspected) exposure to covid-19: Secondary | ICD-10-CM | POA: Diagnosis not present

## 2020-08-05 DIAGNOSIS — I10 Essential (primary) hypertension: Secondary | ICD-10-CM | POA: Insufficient documentation

## 2020-08-05 DIAGNOSIS — M546 Pain in thoracic spine: Secondary | ICD-10-CM | POA: Diagnosis not present

## 2020-08-05 DIAGNOSIS — R5383 Other fatigue: Secondary | ICD-10-CM | POA: Insufficient documentation

## 2020-08-05 DIAGNOSIS — Z79899 Other long term (current) drug therapy: Secondary | ICD-10-CM | POA: Diagnosis not present

## 2020-08-05 DIAGNOSIS — R251 Tremor, unspecified: Secondary | ICD-10-CM | POA: Diagnosis not present

## 2020-08-05 LAB — URINALYSIS, COMPLETE (UACMP) WITH MICROSCOPIC
Bilirubin Urine: NEGATIVE
Glucose, UA: NEGATIVE mg/dL
Ketones, ur: 5 mg/dL — AB
Leukocytes,Ua: NEGATIVE
Nitrite: NEGATIVE
Protein, ur: NEGATIVE mg/dL
Specific Gravity, Urine: 1.002 — ABNORMAL LOW (ref 1.005–1.030)
pH: 6 (ref 5.0–8.0)

## 2020-08-05 LAB — COMPREHENSIVE METABOLIC PANEL
ALT: 16 U/L (ref 0–44)
AST: 16 U/L (ref 15–41)
Albumin: 4.2 g/dL (ref 3.5–5.0)
Alkaline Phosphatase: 62 U/L (ref 38–126)
Anion gap: 9 (ref 5–15)
BUN: 12 mg/dL (ref 6–20)
CO2: 23 mmol/L (ref 22–32)
Calcium: 9.2 mg/dL (ref 8.9–10.3)
Chloride: 105 mmol/L (ref 98–111)
Creatinine, Ser: 0.79 mg/dL (ref 0.44–1.00)
GFR, Estimated: >60 mL/min (ref >60)
Glucose, Bld: 91 mg/dL (ref 70–99)
Potassium: 3.4 mmol/L — ABNORMAL LOW (ref 3.5–5.1)
Sodium: 137 mmol/L (ref 135–145)
Total Bilirubin: 1 mg/dL (ref 0.3–1.2)
Total Protein: 7.7 g/dL (ref 6.5–8.1)

## 2020-08-05 LAB — CBC
HCT: 38.9 % (ref 36.0–46.0)
Hemoglobin: 12.3 g/dL (ref 12.0–15.0)
MCH: 23.9 pg — ABNORMAL LOW (ref 26.0–34.0)
MCHC: 31.6 g/dL (ref 30.0–36.0)
MCV: 75.5 fL — ABNORMAL LOW (ref 80.0–100.0)
Platelets: 318 10*3/uL (ref 150–400)
RBC: 5.15 MIL/uL — ABNORMAL HIGH (ref 3.87–5.11)
RDW: 15.8 % — ABNORMAL HIGH (ref 11.5–15.5)
WBC: 16.4 10*3/uL — ABNORMAL HIGH (ref 4.0–10.5)
nRBC: 0 % (ref 0.0–0.2)

## 2020-08-05 LAB — RESP PANEL BY RT-PCR (FLU A&B, COVID) ARPGX2
Influenza A by PCR: NEGATIVE
Influenza B by PCR: NEGATIVE
SARS Coronavirus 2 by RT PCR: NEGATIVE

## 2020-08-05 LAB — POC URINE PREG, ED: Preg Test, Ur: NEGATIVE

## 2020-08-05 LAB — TROPONIN I (HIGH SENSITIVITY): Troponin I (High Sensitivity): 4 ng/L (ref <18)

## 2020-08-05 NOTE — ED Provider Notes (Signed)
[facility] Emergency Department Provider Note   ____________________________________________   Event Date/Time   First MD Initiated Contact with Patient 08/05/20 0601     (approximate)  I have reviewed the triage vital signs and the nursing notes.   HISTORY  Chief Complaint shaky    HPI Tammy Daugherty is a 35 y.o. female presents for fatigue and tremors  LOCATION: General DURATION: Just prior to arrival TIMING: Improved since onset SEVERITY: Moderate QUALITY: Fatigue and tremors CONTEXT: Patient states that she has had upper back pain on and off throughout the week and woke up this evening feeling "heavy all over and shaky" MODIFYING FACTORS: Denies ASSOCIATED SYMPTOMS: Upper back pain   Per medical record review history of morbid obesity          Past Medical History:  Diagnosis Date   Hypertension     Patient Active Problem List   Diagnosis Date Noted   Polycystic ovarian disease 12/22/2017   Hypertension--HCTZ 12/22/2017   Morbid obesity (HCC) BMI=38.6 12/22/2017    Past Surgical History:  Procedure Laterality Date   denies      Prior to Admission medications   Medication Sig Start Date End Date Taking? Authorizing Provider  hydrochlorothiazide (HYDRODIURIL) 25 MG tablet Take 25 mg by mouth daily.    [provider]  losartan (COZAAR) 25 MG tablet Take 25 mg by mouth daily.    [provider]  Multiple Vitamins-Minerals (EMERGEN-C IMMUNE PLUS/VIT D) CHEW Chew 1 tablet by mouth 1 day or 1 dose.    [provider]  Multiple Vitamins-Minerals (MULTIVITAMIN) tablet Take 1 tablet by mouth daily. 08/05/19   Federico Flake, MD  norethindrone (MICRONOR) 0.35 MG tablet Take 1 tablet (0.35 mg total) by mouth daily. 03/29/19   Sciora, Austin Miles, CNM  norethindrone (MICRONOR) 0.35 MG tablet Take 1 tablet (0.35 mg total) by mouth daily. 08/05/19   Matt Holmes, PA  norethindrone (MICRONOR) 0.35 MG  tablet Take 1 tablet (0.35 mg total) by mouth daily. 01/05/20   Matt Holmes, PA  OVER THE COUNTER MEDICATION 2 units of lipase. 2 tablespoons of Sea Moss Gel qd Patient not taking: Reported on 08/05/2019    [provider]    Allergies Patient has no known allergies.  Family History  Problem Relation Age of Onset   Hyperlipidemia Father    High Cholesterol Father    Kidney disease Father    Heart disease Father    Hypertension Father    Heart disease Maternal Grandmother    Heart attack Maternal Grandmother    Diabetes Maternal Grandmother    Aneurysm Maternal Grandfather    Heart attack Paternal Grandfather    Hypertension Paternal Grandmother     Social History Social History   Tobacco Use   Smoking status: Never   Smokeless tobacco: Never  Substance Use Topics   Alcohol use: Yes    Alcohol/week: 1.0 standard drink    Types: 1 Glasses of wine per week    Comment: socially   Drug use: Not Currently    Review of Systems Constitutional: No fever/chills Eyes: No visual changes. ENT: No sore throat. Cardiovascular: Denies chest pain. Respiratory: Denies shortness of breath. Gastrointestinal: No abdominal pain.  No nausea, no vomiting.  No diarrhea. Genitourinary: Negative for dysuria. Musculoskeletal: Negative for acute arthralgias Skin: Negative for rash. Neurological: Negative for headaches, weakness/numbness/paresthesias in any extremity Psychiatric: Negative for suicidal ideation/homicidal ideation   ____________________________________________   PHYSICAL EXAM:  VITAL SIGNS: ED Triage Vitals [08/05/20 0149]  Enc Vitals Group     BP (!) 166/94     Pulse Rate 84     Resp 16     Temp 98.4 F (36.9 C)     Temp Source Oral     SpO2 100 %     Weight 245 lb (111.1 kg)     Height 5\' 8"  (1.727 m)     Head Circumference      Peak Flow      Pain Score 0     Pain Loc      Pain Edu?      Excl. in GC?    Constitutional: Alert and oriented.  Well appearing and in no acute distress. Eyes: Conjunctivae are normal. PERRL. Head: Atraumatic. Nose: No congestion/rhinnorhea. Mouth/Throat: Mucous membranes are moist. Neck: No stridor Cardiovascular: Grossly normal heart sounds.  Good peripheral circulation. Respiratory: Normal respiratory effort.  No retractions. Gastrointestinal: Soft and nontender. No distention. Musculoskeletal: No obvious deformities Neurologic:  Normal speech and language. No gross focal neurologic deficits are appreciated. Skin:  Skin is warm and dry. No rash noted. Psychiatric: Mood and affect are normal. Speech and behavior are normal.  ____________________________________________   LABS (all labs ordered are listed, but only abnormal results are displayed)  Labs Reviewed  CBC - Abnormal; Notable for the following components:      Result Value   WBC 16.4 (*)    RBC 5.15 (*)    MCV 75.5 (*)    MCH 23.9 (*)    RDW 15.8 (*)    All other components within normal limits  COMPREHENSIVE METABOLIC PANEL - Abnormal; Notable for the following components:   Potassium 3.4 (*)    All other components within normal limits  RESP PANEL BY RT-PCR (FLU A&B, COVID) ARPGX2  URINALYSIS, COMPLETE (UACMP) WITH MICROSCOPIC  POC URINE PREG, ED  TROPONIN I (HIGH SENSITIVITY)  TROPONIN I (HIGH SENSITIVITY)   ____________________________________________  EKG  ED ECG REPORT I, , the attending physician, personally viewed and interpreted this ECG.  Date: 08/05/2020 EKG Time: 0202 Rate: 79 Rhythm: normal sinus rhythm QRS Axis: normal Intervals: normal ST/T Wave abnormalities: normal Narrative Interpretation: no evidence of acute ischemia  PROCEDURES  Procedure(s) performed (including Critical Care):  Procedures   ____________________________________________   INITIAL IMPRESSION / ASSESSMENT AND PLAN / ED COURSE  As part of my medical decision making, I reviewed the following data within  the electronic medical record, if available:  Nursing notes reviewed and incorporated, Labs reviewed, EKG interpreted, Old chart reviewed, Radiograph reviewed and Notes from prior ED visits reviewed and incorporated        Patient is a 35 year old female who presents for fatigue and tremors that occurred just prior to arrival.  Patient is pending work-up at the end of my shift.  Care of this patient will be signed out to the oncoming physician at the end of my shift.  All pertinent patient information conveyed and all questions answered.  All further care and disposition decisions will be made by the oncoming physician.      ____________________________________________   FINAL CLINICAL IMPRESSION(S) / ED DIAGNOSES  Final diagnoses:  None     ED Discharge Orders     None        Note:  This document was prepared using Dragon voice recognition software and may include unintentional dictation errors.    30, MD 08/05/20 [phone]

## 2020-08-05 NOTE — ED Triage Notes (Signed)
Pt states this week has had off and on upper back pain. Pt states she woke up tonight feeling "heavy all over and shaky". Pt denies history of DM, htn. Pt denies shob, headache, chest pain.

## 2020-08-05 NOTE — ED Provider Notes (Signed)
  Patient received in signout from Dr. Vicente Males pending urinalysis, CXR and COVID swab.  UA noted without infectious features, CXR without evidence of acute cardiopulmonary pathology and her COVID swab is pending at the time of reevaluation of the patient.  I reassessed the patient and she is sitting up in bed comfortably with normal vital signs and says she feels well, and that her presenting symptoms have resolved.  We discussed benign work-up so far and she indicates that she was going to go on a trip today and is eager to go home.  And this is reasonable considering how well she looks.  We discussed pending COVID swab and how to check this on her MyChart results.  We discussed return precautions for the ED. Her isolated leukocytosis is noted on her blood work.  Review of her records indicates that she had a more pronounced leukocytosis 1.5 years ago when she was evaluated here for atypical chest pains.  She certainly has no evidence of sepsis or acute infectious pathology to precipitate this leukocytosis.  I see no barriers to outpatient management at this time.   Delton Prairie, MD 08/05/20 [phone]

## 2021-05-02 ENCOUNTER — Encounter: Payer: Self-pay | Admitting: Family Medicine

## 2021-05-02 ENCOUNTER — Ambulatory Visit (LOCAL_COMMUNITY_HEALTH_CENTER): Payer: BLUE CROSS/BLUE SHIELD | Admitting: Family Medicine

## 2021-05-02 ENCOUNTER — Ambulatory Visit: Payer: BLUE CROSS/BLUE SHIELD

## 2021-05-02 VITALS — BP 145/86 | Ht 67.6 in | Wt 257.6 lb

## 2021-05-02 DIAGNOSIS — F419 Anxiety disorder, unspecified: Secondary | ICD-10-CM | POA: Diagnosis not present

## 2021-05-02 DIAGNOSIS — Z113 Encounter for screening for infections with a predominantly sexual mode of transmission: Secondary | ICD-10-CM

## 2021-05-02 DIAGNOSIS — Z3009 Encounter for other general counseling and advice on contraception: Secondary | ICD-10-CM | POA: Diagnosis not present

## 2021-05-02 DIAGNOSIS — Z0000 Encounter for general adult medical examination without abnormal findings: Secondary | ICD-10-CM | POA: Diagnosis not present

## 2021-05-02 LAB — WET PREP FOR TRICH, YEAST, CLUE
Trichomonas Exam: NEGATIVE
Yeast Exam: NEGATIVE

## 2021-05-02 NOTE — Progress Notes (Signed)
[facility] DEPARTMENT ?Family Planning Clinic ?[address] [employer] ?Main Number: [phone] ? ?Family Planning Visit- Repeat Yearly Visit ? ?Subjective:  ?Tammy Daugherty is a 36 y.o. G1P0010  being seen today for an annual wellness visit and to discuss contraception options.   The patient is currently using No Method - No Contraceptive Precautions for pregnancy prevention. Patient does not want a pregnancy in the next year.  ? ? report they are looking for a method that provides Other pt does not desire BCM.  ? ? ?Patient has the following medical problems: has Polycystic ovarian disease; Hypertension--HCTZ; and Morbid obesity (HCC) BMI=38.6 on their problem list. ? ?Chief Complaint  ?Patient presents with  ? Annual Exam  ?  PE  ? ? ?Patient reports here for physical exam   ? ?See flowsheet for other program required questions.  ? ?Body mass index is 39.63 kg/m?. - Patient is eligible for diabetes screening based on BMI and age >68?  not applicable ?HA1C ordered? no ? ?Patient reports 1 of partners in last year. Desires STI screening?  Yes ? ? ?Has patient been screened once for HCV in the past?  No ? No results found for: HCVAB ? ?Does the patient have current of drug use, have a partner with drug use, and/or has been incarcerated since last result? No  ?If yes-- Screen for HCV through [facility] ?  ?Does the patient meet criteria for HBV testing? Yes ? ?Criteria:  ?-Household, sexual or needle sharing contact with HBV ?-History of drug use ?-HIV positive ?-Those with known Hep C ? ? ?Health Maintenance Due  ?Topic Date Due  ? COVID-19 Vaccine (1) Never done  ? Hepatitis C Screening  Never done  ? ? ?Review of Systems  ?Constitutional:  Negative for chills, fever, malaise/fatigue and weight loss.  ?HENT:  Negative for congestion, hearing loss and sore throat.   ?Eyes:  Negative for blurred vision, double vision and photophobia.  ?Respiratory:  Negative for shortness of breath.    ?Cardiovascular:  Negative for chest pain.  ?Gastrointestinal:  Negative for abdominal pain, blood in stool, constipation, diarrhea, heartburn, nausea and vomiting.  ?Genitourinary:  Negative for dysuria and frequency.  ?Musculoskeletal:  Negative for back pain, joint pain and neck pain.  ?Skin:  Negative for itching and rash.  ?Neurological:  Negative for dizziness, weakness and headaches.  ?Endo/Heme/Allergies:  Does not bruise/bleed easily.  ?Psychiatric/Behavioral:  Negative for depression, substance abuse and suicidal ideas.   ? ?The following portions of the patient's history were reviewed and updated as appropriate: allergies, current medications, past family history, past medical history, past social history, past surgical history and problem list. Problem list updated. ? ?Objective:  ? ?Vitals:  ? 05/02/21 0841 05/02/21 0851  ?BP: (!) 145/90 (!) 145/86  ?Weight: 257 lb 9.6 oz (116.8 kg)   ?Height: 5' 7.6" (1.717 m)   ? ? ?Physical Exam ?Vitals and nursing note reviewed.  ?Constitutional:   ?   Appearance: Normal appearance.  ?HENT:  ?   Head: Normocephalic and atraumatic.  ?   Mouth/Throat:  ?   Mouth: Mucous membranes are moist.  ?   Dentition: Normal dentition. No dental caries.  ?   Pharynx: No oropharyngeal exudate or posterior oropharyngeal erythema.  ?Eyes:  ?   General: No scleral icterus. ?Neck:  ?   Thyroid: No thyroid mass, thyromegaly or thyroid tenderness.  ?Cardiovascular:  ?   Rate and Rhythm: Normal rate.  ?  Pulses: Normal pulses.  ?Pulmonary:  ?   Effort: Pulmonary effort is normal.  ?Chest:  ?Breasts: ?   Tanner Score is 5.  ?   Breasts are symmetrical.  ?   Comments: Breasts:  ?      Right: Normal. No swelling, mass, nipple discharge, skin change or tenderness.  ?      Left: Normal. No swelling, mass, nipple discharge, skin change or tenderness.   ?Abdominal:  ?   General: Abdomen is flat. Bowel sounds are normal.  ?   Palpations: Abdomen is soft.  ?Genitourinary: ?   Comments: Deferred   ?Musculoskeletal:     ?   General: Normal range of motion.  ?Skin: ?   General: Skin is warm and dry.  ?Neurological:  ?   General: No focal deficit present.  ?   Mental Status: She is alert.  ?Psychiatric:     ?   Mood and Affect: Mood normal.     ?   Behavior: Behavior normal.  ? ? ? ? ?Assessment and Plan:  ?Tammy Daugherty is a 36 y.o. female G1P0010 presenting to the [facility] Department for an yearly wellness and contraception visit ? ? ?Contraception counseling: Reviewed options based on patient desire and reproductive life plan. Patient is interested in No Method - No Contraceptive Precautions. This was provided to the patient today.  ? ?Risks, benefits, and typical effectiveness rates were reviewed.  Questions were answered.  Written information was also given to the patient to review.   ? ?The patient will follow up in  as needed for surveillance.  The patient was told to call with any further questions, or with any concerns about this method of contraception.  Emphasized use of condoms 100% of the time for STI prevention. ? ?Patient was assessed for need for ECP. Patient was not offered ECP based on > 120 hours   Patient is within 10 days of unprotected sex. Patient was not offered ECP.  ? ?1. Routine general medical examination at a health care facility ?Well woman exam  ?CBE today  ?Pap due 12/2022 ? ?2. Screening examination for venereal disease ?Patient accepted all screenings including wet prep, vaginal CT/GC and declined bloodwork for HIV/RPR.  ?  ?Wet prep results neg    ?No Treatment needed ?Discussed time line for State Lab results and that patient will be called with positive results and encouraged patient to call if she had not heard in 2 weeks.  ?Counseled to return or seek care for continued or worsening symptoms ?Recommended condom use with all sex ? ?- Chlamydia/Gonorrhea [facility] ?- WET PREP FOR TRICH, YEAST, CLUE ? ?3. Anxiety ?Pt desires methods to help cope with  anxiety  ?- Ambulatory referral to Behavioral Health ? ? ? ? ?No follow-ups on file. ? ?No future appointments. ? ?Wendi Snipes, FNP ? ?

## 2021-05-02 NOTE — Progress Notes (Signed)
Pt here for PE.  Wet mount results reviewed, no treatment required per SO.  Loranzo Desha M Jaylah Goodlow, RN ° °

## 2021-05-07 ENCOUNTER — Ambulatory Visit: Payer: BLUE CROSS/BLUE SHIELD

## 2021-06-07 ENCOUNTER — Ambulatory Visit: Payer: BLUE CROSS/BLUE SHIELD | Admitting: Licensed Clinical Social Worker

## 2021-06-27 ENCOUNTER — Ambulatory Visit: Payer: BLUE CROSS/BLUE SHIELD | Admitting: Licensed Clinical Social Worker

## 2021-06-28 ENCOUNTER — Ambulatory Visit: Payer: BLUE CROSS/BLUE SHIELD | Admitting: Licensed Clinical Social Worker

## 2021-06-28 DIAGNOSIS — F411 Generalized anxiety disorder: Secondary | ICD-10-CM
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
## 2021-07-19 ENCOUNTER — Ambulatory Visit: Payer: Self-pay | Admitting: Licensed Clinical Social Worker

## 2021-07-19 DIAGNOSIS — F411 Generalized anxiety disorder: Secondary | ICD-10-CM

## 2021-07-19 NOTE — Progress Notes (Signed)
Counselor/Therapist Progress Note  Patient ID: Tammy Daugherty, MRN: [id number],    Date: 07/19/2021  Time Spent: 45 minutes   Treatment Type: Psychotherapy  Reported Symptoms:  Anxiety, anxious thoughts   Mental Status Exam:  Appearance:   Neat and Well Groomed     Behavior:  Appropriate and Sharing  Motor:  Normal  Speech/Language:   Clear and Coherent and Normal Rate  Affect:  Appropriate, Congruent, and Full Range  Mood:  normal  Thought process:  normal  Thought content:    WNL  Sensory/Perceptual disturbances:    WNL  Orientation:  oriented to person, place, time/date, and situation  Attention:  Good  Concentration:  Good  Memory:  WNL  Fund of knowledge:   Good  Insight:    Good  Judgment:   Good  Impulse Control:  Good   Risk Assessment: Danger to Self:  No Self-injurious Behavior: No Danger to Others: No Duty to Warn:no Physical Aggression / Violence:No  Access to Firearms a concern: No  Gang Involvement:No   Subjective: Patient was engaged and cooperative throughout the session using time effectively to discuss assessment, goal of treatment and treatment plan. Patient voices continued motivation for treatment and understanding of CBTs. Patient is likely to benefit from future treatment because she is motivated decrease anxiety and improve functioning.     Interventions: Cognitive Behavioral Therapy  Checked in with patient and reviewed previous session, including assessment and goal of treatment. Reviewed CBTs. Explored patient's goal of treatment and worked collaboratively to develop CBTs treatment plan. LCSW taught patient about Unhelpful Thinking Styles and Radical Acceptance. Provided support through active listening, validation of feelings, and highlighted patient's strengths.   Diagnosis:   ICD-10-CM   1. Generalized anxiety disorder  F41.1      Plan: Patient's goal of treatment is to understand and process what she is feeling, recognize her triggers  and how to deal with them.   Treatment Target: Understand the relationship between thoughts, emotions, and behaviors  Psychoeducation on CBT model   Oriented the client to the therapeutic approach Teach the connection between thoughts, emotions, and behaviors  Treatment Target: Increase realistic balanced thinking -to learn how to replace thinking with thoughts that are more accurate or helpful Explore patient's thoughts, beliefs, automatic thoughts, assumptions  Identify and replace unhelpful thinking patterns (upsetting ideas, self-talk and mental images) Process distress and allow for emotional release  Questioning and challenging thoughts Cognitive reappraisal  Restructuring, Socratic questioning  Provided psychoeducation on core beliefs, explore, and assist patient in identifying core beliefs  Treatment Target: Increase coping skills Mindfulness practices  Values-guided behavioral change Radical Acceptance  STOP   Future Appointments  Date Time Provider Department Center  08/09/2021  1:40 PM Kathreen Cosier, LCSW [facility] None   Kathreen Cosier, LCSW

## 2021-08-09 ENCOUNTER — Ambulatory Visit: Payer: Self-pay | Admitting: Licensed Clinical Social Worker

## 2021-08-16 ENCOUNTER — Telehealth: Payer: Self-pay | Admitting: Licensed Clinical Social Worker

## 2021-08-16 NOTE — Telephone Encounter (Signed)
Patient lvm regarding missed appointment. LCSW attempted to return call and lvm.

## 2021-12-05 IMAGING — CR DG CHEST 2V
1 series · 2 of 2 positions shown · non-contrast
Comparison: None.

CLINICAL DATA: Chest pain

EXAM:
CHEST - 2 VIEW

[Series 1: dg chest 2 view · 0.14mm/px · 2 of 2 slices shown]
[im 1/2]
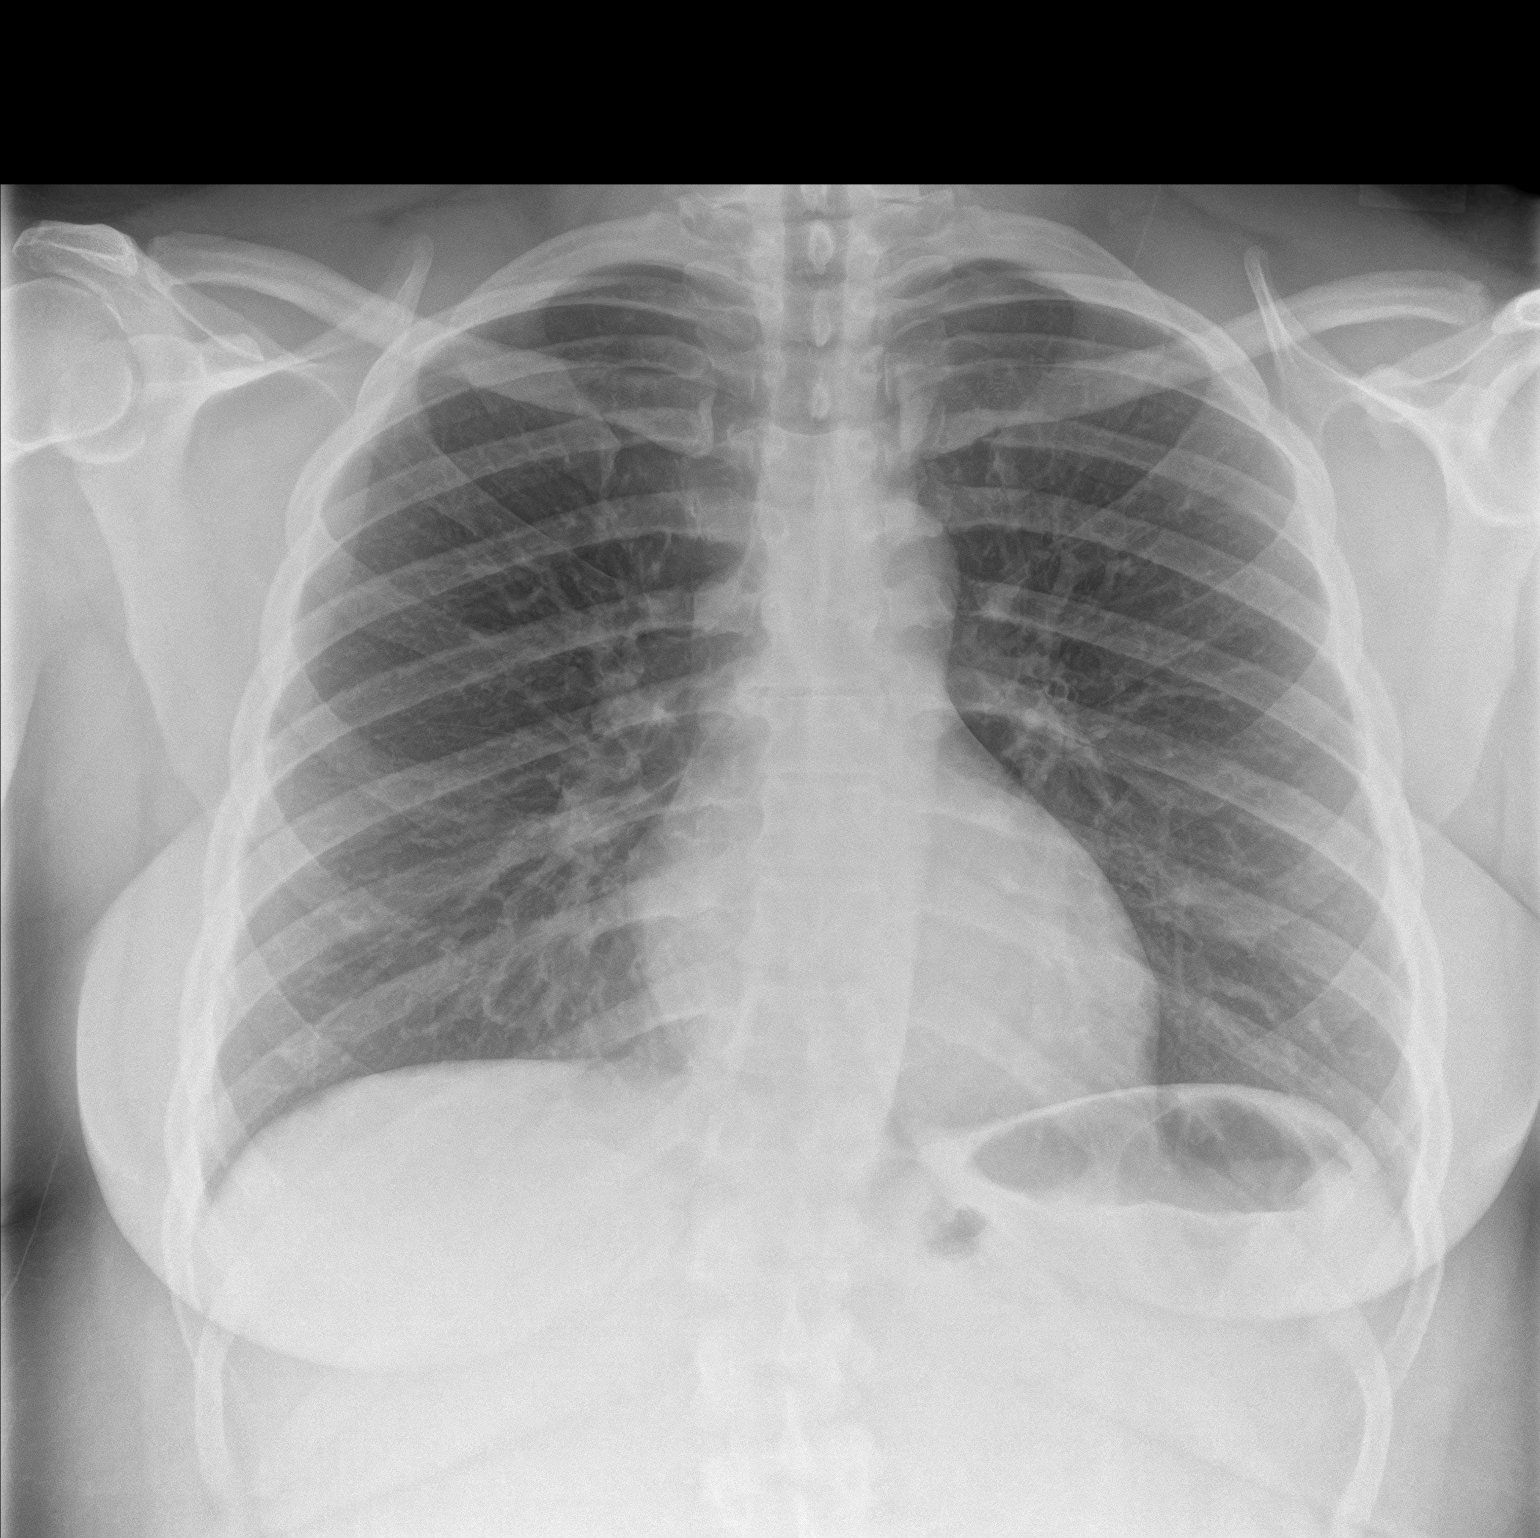
[im 2/2]
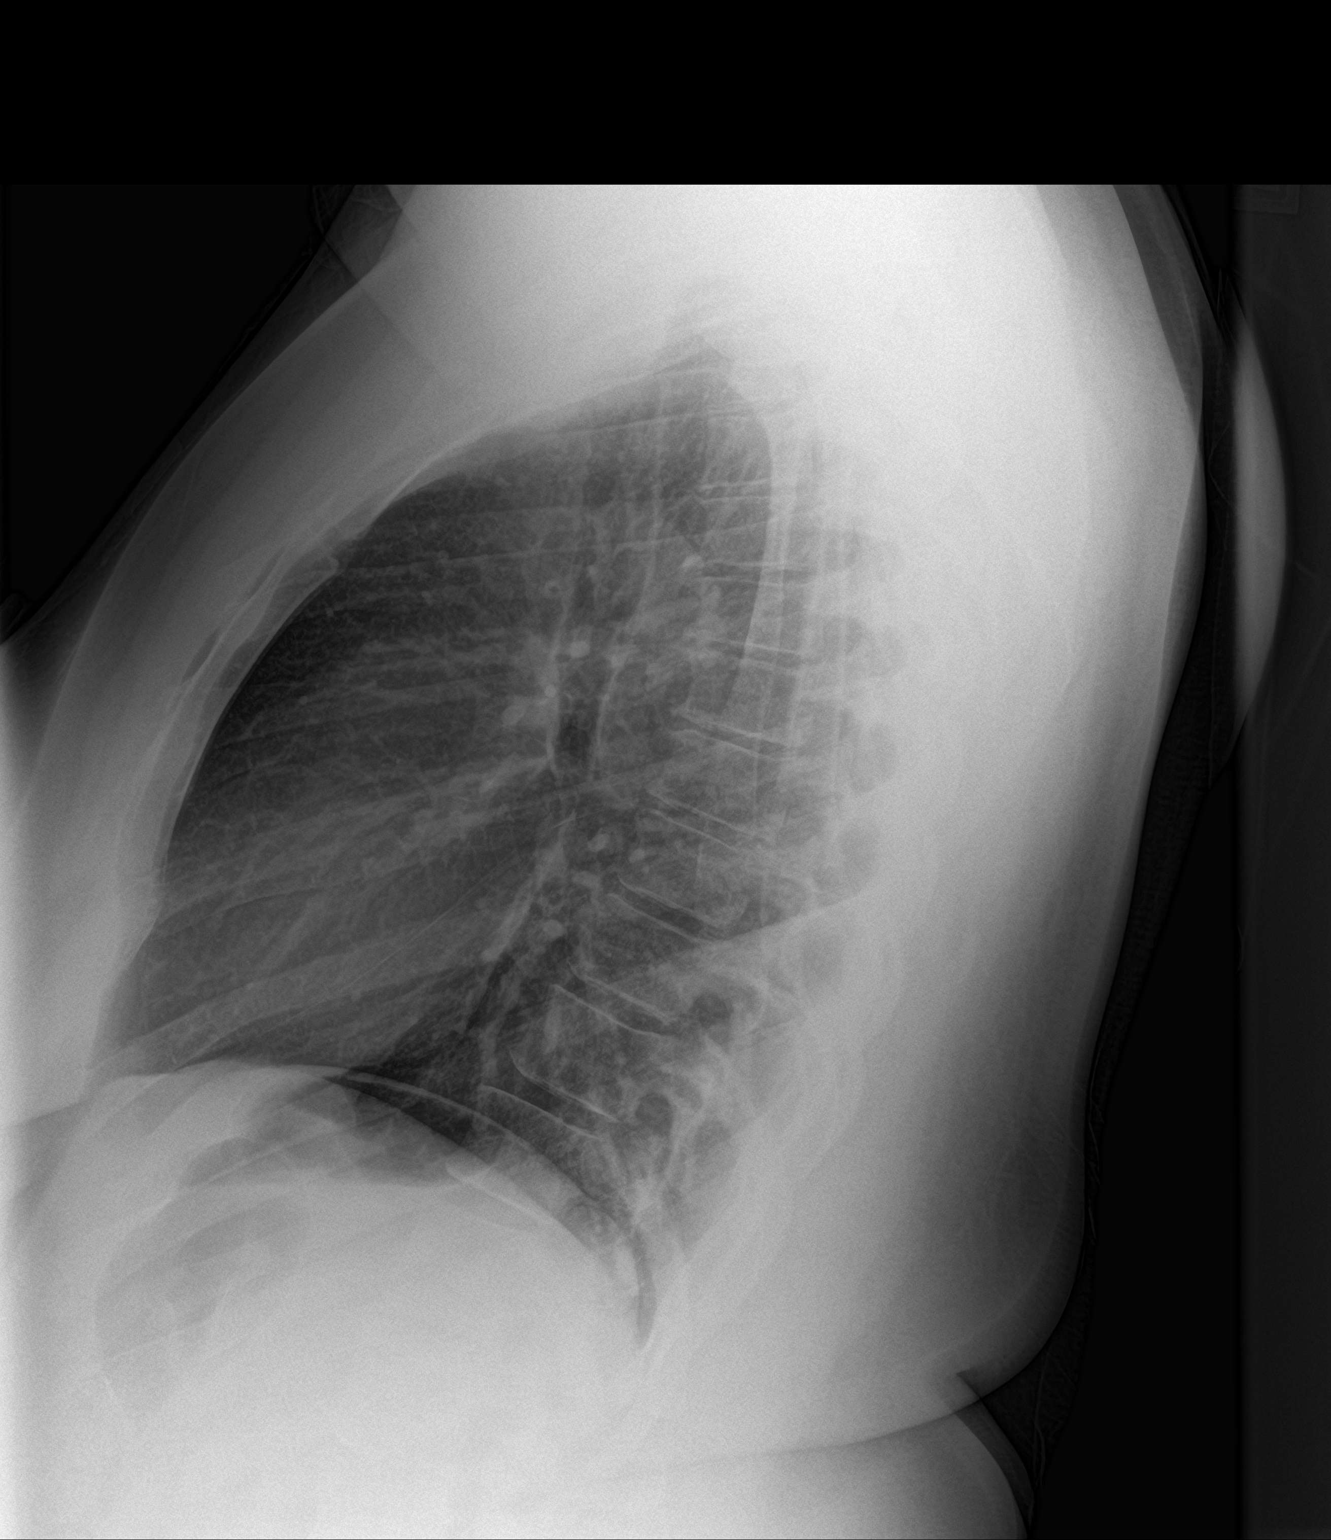

[2 of 2 positions shown; findings below may reference images not displayed]

FINDINGS: The heart size and mediastinal contours are within normal limits.
Both lungs are clear. The visualized skeletal structures are
unremarkable.
IMPRESSION: No acute abnormality of the lungs.

## 2022-05-24 ENCOUNTER — Encounter: Payer: Self-pay | Admitting: Emergency Medicine

## 2022-05-24 ENCOUNTER — Ambulatory Visit
Admission: EM | Admit: 2022-05-24 | Discharge: 2022-05-24 | Disposition: A | Discharge status: Home / Self Care | Payer: BLUE CROSS/BLUE SHIELD | Attending: Emergency Medicine | Admitting: Emergency Medicine

## 2022-05-24 DIAGNOSIS — Z1152 Encounter for screening for COVID-19: Secondary | ICD-10-CM | POA: Insufficient documentation

## 2022-05-24 DIAGNOSIS — J069 Acute upper respiratory infection, unspecified: Secondary | ICD-10-CM | POA: Insufficient documentation

## 2022-05-24 LAB — RAPID INFLUENZA A&B ANTIGENS
Influenza A (ARMC): NEGATIVE
Influenza B (ARMC): NEGATIVE

## 2022-05-24 LAB — SARS CORONAVIRUS 2 BY RT PCR: SARS Coronavirus 2 by RT PCR: NEGATIVE

## 2022-05-24 MED ORDER — IPRATROPIUM BROMIDE 0.06 % NA SOLN: 2.0000 | Freq: Four times a day (QID) | NASAL | 12 refills | Status: AC

## 2022-05-24 NOTE — ED Triage Notes (Signed)
Patient c/o fatigue, bodyaches, and HA that started yesterday and today had fever of 100.4.  Patient reports she has been fasting since Monday.  Patient states that she has been drinking liquids and eating some fruits.

## 2022-05-24 NOTE — ED Provider Notes (Signed)
[facility]    CSN: [id number] Arrival date & time: 05/24/22  1557      History   Chief Complaint Chief Complaint  Patient presents with   Fatigue   Fever    HPI Tammy Daugherty is a 37 y.o. female.   HPI  37 year old female with history of hypertension, polycystic ovarian disease, and obesity presents for evaluation of headache, body aches, fatigue, and a fever of 100.4 that started today.  She also has some pain in her ears when she swallows.  She reports that she has been fasting for weight loss all week but she has been drinking fluids and eating fruit.  She denies runny nose, nasal congestion, cough, or GI symptoms.  Past Medical History:  Diagnosis Date   Hypertension     Patient Active Problem List   Diagnosis Date Noted   Polycystic ovarian disease 12/22/2017   Hypertension--HCTZ 12/22/2017   Morbid obesity (HCC) BMI=38.6 12/22/2017    Past Surgical History:  Procedure Laterality Date   denies      OB History     Gravida  1   Para      Term      Preterm      AB  1   Living  0      SAB      IAB      Ectopic      Multiple      Live Births               Home Medications    Prior to Admission medications   Medication Sig Start Date End Date Taking? Authorizing Provider  ipratropium (ATROVENT) 0.06 % nasal spray Place 2 sprays into both nostrils 4 (four) times daily. 05/24/22  Yes Becky Augusta, NP  hydrochlorothiazide (HYDRODIURIL) 25 MG tablet Take 25 mg by mouth daily. Patient not taking: Reported on 05/02/2021    [provider]  losartan (COZAAR) 25 MG tablet Take 25 mg by mouth daily. Patient not taking: Reported on 05/02/2021    [provider]  Multiple Vitamins-Minerals (EMERGEN-C IMMUNE PLUS/VIT D) CHEW Chew 1 tablet by mouth 1 day or 1 dose. Patient not taking: Reported on 05/02/2021    [provider]  Multiple Vitamins-Minerals (MULTIVITAMIN) tablet Take 1 tablet by mouth  daily. Patient not taking: Reported on 05/02/2021 08/05/19   Federico Flake, MD  norethindrone (MICRONOR) 0.35 MG tablet Take 1 tablet (0.35 mg total) by mouth daily. Patient not taking: Reported on 05/02/2021 03/29/19   Alberteen Spindle, CNM  norethindrone (MICRONOR) 0.35 MG tablet Take 1 tablet (0.35 mg total) by mouth daily. Patient not taking: Reported on 05/02/2021 08/05/19   Matt Holmes, PA  norethindrone (MICRONOR) 0.35 MG tablet Take 1 tablet (0.35 mg total) by mouth daily. Patient not taking: Reported on 05/02/2021 01/05/20   Matt Holmes, PA  OVER THE COUNTER MEDICATION 2 units of lipase. 2 tablespoons of Sea Moss Gel qd Patient not taking: Reported on 08/05/2019    [provider]    Family History Family History  Problem Relation Age of Onset   Hypertension Mother    Hyperlipidemia Father    High Cholesterol Father    Kidney disease Father    Heart disease Father    Hypertension Father    Heart disease Maternal Grandmother    Heart attack Maternal Grandmother    Diabetes Maternal Grandmother    Aneurysm Maternal Grandfather    Hypertension Paternal Grandmother  Heart attack Paternal Grandfather     Social History Social History   Tobacco Use   Smoking status: Never   Smokeless tobacco: Never  Vaping Use   Vaping Use: Never used  Substance Use Topics   Alcohol use: Yes    Alcohol/week: 1.0 standard drink of alcohol    Types: 1 Glasses of wine per week    Comment: socially   Drug use: Not Currently    Types: Marijuana     Allergies   Patient has no known allergies.   Review of Systems Review of Systems  Constitutional:  Positive for fatigue and fever.  HENT:  Positive for ear pain. Negative for congestion, rhinorrhea and sore throat.   Respiratory:  Negative for cough.   Gastrointestinal:  Negative for diarrhea, nausea and vomiting.  Musculoskeletal:  Positive for arthralgias and myalgias.  Neurological:  Positive for  headaches.     Physical Exam Triage Vital Signs ED Triage Vitals  Enc Vitals Group     BP 05/24/22 1608 (!) 141/85     Pulse Rate 05/24/22 1608 94     Resp 05/24/22 1608 14     Temp 05/24/22 1608 98.4 F (36.9 C)     Temp Source 05/24/22 1608 Oral     SpO2 05/24/22 1608 99 %     Weight 05/24/22 1606 260 lb (117.9 kg)     Height 05/24/22 1606 5' 7.5" (1.715 m)     Head Circumference --      Peak Flow --      Pain Score 05/24/22 1606 0     Pain Loc --      Pain Edu? --      Excl. in GC? --    No data found.  Updated Vital Signs BP (!) 141/85 (BP Location: Left Arm)   Pulse 94   Temp 98.4 F (36.9 C) (Oral)   Resp 14   Ht 5' 7.5" (1.715 m)   Wt 260 lb (117.9 kg)   LMP 05/23/2022 (Exact Date)   SpO2 99%   BMI 40.12 kg/m   Visual Acuity Right Eye Distance:   Left Eye Distance:   Bilateral Distance:    Right Eye Near:   Left Eye Near:    Bilateral Near:     Physical Exam Vitals and nursing note reviewed.  Constitutional:      Appearance: Normal appearance. She is not ill-appearing.  HENT:     Head: Normocephalic and atraumatic.     Right Ear: Tympanic membrane, ear canal and external ear normal. There is no impacted cerumen.     Left Ear: Tympanic membrane, ear canal and external ear normal. There is no impacted cerumen.     Nose: Congestion present. No rhinorrhea.     Comments: Nasal mucosa is mildly erythematous and edematous without any appreciable discharge.    Mouth/Throat:     Mouth: Mucous membranes are moist.     Pharynx: Oropharynx is clear. No oropharyngeal exudate or posterior oropharyngeal erythema.  Cardiovascular:     Rate and Rhythm: Normal rate and regular rhythm.     Pulses: Normal pulses.     Heart sounds: Normal heart sounds. No murmur heard.    No friction rub. No gallop.  Pulmonary:     Effort: Pulmonary effort is normal.     Breath sounds: Normal breath sounds. No wheezing, rhonchi or rales.  Musculoskeletal:     Cervical back:  Normal range of motion and neck supple.  Lymphadenopathy:  Cervical: No cervical adenopathy.  Skin:    General: Skin is warm and dry.     Capillary Refill: Capillary refill takes less than 2 seconds.     Findings: No erythema or rash.  Neurological:     General: No focal deficit present.     Mental Status: She is alert and oriented to person, place, and time.      UC Treatments / Results  Labs (all labs ordered are listed, but only abnormal results are displayed) Labs Reviewed  SARS CORONAVIRUS 2 BY RT PCR  RAPID INFLUENZA A&B ANTIGENS    EKG   Radiology No results found.  Procedures Procedures (including critical care time)  Medications Ordered in UC Medications - No data to display  Initial Impression / Assessment and Plan / UC Course  I have reviewed the triage vital signs and the nursing notes.  Pertinent labs & imaging results that were available during my care of the patient were reviewed by me and considered in my medical decision making (see chart for details).   Patient is a pleasant, nontoxic-appearing 37 year old female who is requesting COVID and influenza testing due to onset of headache, body aches, fatigue, and fever with a Tmax 100.4 that started today.  She also endorses some ear soreness with swallowing but no pain otherwise.  She denies upper or lower respiratory symptoms and she denies GI symptoms.  No known sick contacts other than being around a small child yesterday who was coughing.  She has been fasting all week for weight loss but is drinking fluids and eating fruit.  Her physical exam reveals some mild inflammation of her nasal mucosa but no rhinorrhea.  Oropharyngeal exam is benign as is her cardiopulmonary exam.  Flu antigen test and COVID PCR were both collected at triage per patient's request.  Flu antigen test is negative for influenza A and B.  COVID PCR is negative.  I will discharge patient on the diagnosis of viral URI.  She can use  over-the-counter Tylenol and/or ibuprofen as needed for fever.  I will prescribe Atrovent nasal spray to help with the nasal congestion found on physical exam.   Final Clinical Impressions(s) / UC Diagnoses   Final diagnoses:  Viral URI     Discharge Instructions      Your testing today was negative for COVID and influenza.  I do believe you have a viral respiratory illness.  Your symptoms are mild now but more symptoms may present over time.  Please use over-the-counter Tylenol and/or ibuprofen according to package instructions as needed for fever or bodyaches.  I am going to prescribe you Atrovent nasal spray to help with the nasal congestion I found on your exam.  You can use 2 squirts in each nostril every 6 hours as needed.  If you develop any new or worsening symptoms please return for reevaluation or see your primary care provider.     ED Prescriptions     Medication Sig Dispense Auth. Provider   ipratropium (ATROVENT) 0.06 % nasal spray Place 2 sprays into both nostrils 4 (four) times daily. 15 mL Becky Augusta, NP      PDMP not reviewed this encounter.   Becky Augusta, NP 05/24/22 [phone]

## 2022-05-24 NOTE — Discharge Instructions (Addendum)
Your testing today was negative for COVID and influenza.  I do believe you have a viral respiratory illness.  Your symptoms are mild now but more symptoms may present over time.  Please use over-the-counter Tylenol and/or ibuprofen according to package instructions as needed for fever or bodyaches.  I am going to prescribe you Atrovent nasal spray to help with the nasal congestion I found on your exam.  You can use 2 squirts in each nostril every 6 hours as needed.  If you develop any new or worsening symptoms please return for reevaluation or see your primary care provider.
